# Patient Record
Sex: Male | Born: 1965 | Race: White | Hispanic: No | Marital: Married | Smoking: Former smoker
Health system: Southern US, Community
[De-identification: ages and names within clinical notes are randomized; demographics above are authoritative.]

## PROBLEM LIST (undated history)

## (undated) DIAGNOSIS — I472 Ventricular tachycardia, unspecified: Secondary | ICD-10-CM

## (undated) DIAGNOSIS — IMO0001 Reserved for inherently not codable concepts without codable children: Secondary | ICD-10-CM

## (undated) HISTORY — DX: Ventricular tachycardia: I47.2

## (undated) HISTORY — DX: Ventricular tachycardia, unspecified: I47.20

## (undated) HISTORY — DX: Reserved for inherently not codable concepts without codable children: IMO0001

---

## 1998-11-21 ENCOUNTER — Encounter: Payer: Self-pay | Admitting: Emergency Medicine

## 1998-11-21 ENCOUNTER — Emergency Department (HOSPITAL_COMMUNITY): Admission: EM | Admit: 1998-11-21 | Discharge: 1998-11-21 | Payer: Self-pay | Admitting: Emergency Medicine

## 1998-12-12 ENCOUNTER — Emergency Department (HOSPITAL_COMMUNITY): Admission: EM | Admit: 1998-12-12 | Discharge: 1998-12-12 | Payer: Self-pay | Admitting: Emergency Medicine

## 1998-12-12 ENCOUNTER — Encounter: Payer: Self-pay | Admitting: Emergency Medicine

## 2002-07-16 ENCOUNTER — Emergency Department (HOSPITAL_COMMUNITY): Admission: EM | Admit: 2002-07-16 | Discharge: 2002-07-16 | Payer: Self-pay | Admitting: Emergency Medicine

## 2002-07-16 ENCOUNTER — Encounter: Payer: Self-pay | Admitting: Emergency Medicine

## 2003-12-23 ENCOUNTER — Ambulatory Visit: Payer: Self-pay | Admitting: Internal Medicine

## 2005-05-30 ENCOUNTER — Ambulatory Visit: Payer: Self-pay | Admitting: Internal Medicine

## 2007-12-09 ENCOUNTER — Ambulatory Visit: Payer: Self-pay | Admitting: Internal Medicine

## 2009-11-04 ENCOUNTER — Telehealth: Payer: Self-pay | Admitting: Internal Medicine

## 2009-12-21 ENCOUNTER — Encounter (INDEPENDENT_AMBULATORY_CARE_PROVIDER_SITE_OTHER): Payer: Self-pay | Admitting: *Deleted

## 2010-03-03 NOTE — Letter (Signed)
Summary: Appointment - Missed  [facility], Main Office  [address]   [place], [place] [address]   Phone: [phone]  Fax: [phone]     December 21, 2009 MRN: [id number]   Cody Harris [address] [place], [place]  [address]   Dear Cody Harris,  Our records indicate you missed your appointment on 12-09-2009  with  Dr. Graciela Husbands  It is very important that we reach you to reschedule this appointment. We look forward to participating in your health care needs. Please contact us at the number listed above at your earliest convenience to reschedule this appointment.     Sincerely,   Lorne Skeens  [facility] Scheduling Team

## 2010-03-03 NOTE — Progress Notes (Signed)
Summary: refill needed   Phone Note Refill Request Call back at [phone] Message from:  wife on Cody Harris  Refills Requested: Medication #1:  ATENOLOL 50 MG TABS take one tablet once daily. [facility] [address]  Refill needed  Wife states refill was denied, per pharmacy   Method Requested: Electronic Initial call taken by: Burnard Leigh,  November 04, 2009 12:38 PM  Follow-up for Phone Call        Refill was denied based on pt not having been seen here since 2009.  Pt has been informed that he needs an ov or we need to know if he has established with another care provider. Called spouse back at number left and left msg on voicemail to call us back.  Either appt needs to be scheduled or pt will need to be followed by another physician Follow-up by: Judithe Modest CMA,  November 04, 2009 1:36 PM    Prescriptions: ATENOLOL 50 MG TABS (ATENOLOL) take one tablet once daily  #90 x 2   Entered by:   Judithe Modest CMA   Authorized by:   Nathen May, MD, [facility]   Signed by:   Judithe Modest CMA on 11/04/2009   Method used:   Electronically to        [facility]  [address] [id number]* (retail)       [address]       [place], [place]  [address]       Ph: [phone] or [phone]       Fax: [phone]   RxID:   [id number]

## 2010-06-14 NOTE — Assessment & Plan Note (Signed)
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  He has done quite quiescent without episodes  of tachy palpitations for sometime.  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  He is currently  in [place] and [place].   Medications include:  1. Atenolol 50.  2. Multivitamins.   On examination, his blood pressure is 161/100.  He insists that his  blood pressure when he records it twice a week at home is in the 130/70  range.  His pulse is 60.  His weight is 276 which is, unfortunately for  him, relatively stable and maybe increased some.  He had gone from 290-  238 about 3 years ago and has put the other way back on.  He is walking  6-10 miles a day.  He says his lungs were clear.  Heart sounds were  regular.  Extremities were without edema.   Electrocardiogram dated today demonstrated sinus rhythm at 60 with  intervals of 0.16/0.10/0.41.   IMPRESSION:  1. 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  2. White coat hypertension.   Cody Harris is stable at this point.  We will see him again 1 year's  time.     Duke Salvia, MD, [facility]  Electronically Signed    SCK/MedQ  DD: 12/09/2007  DT: 12/10/2007  Job #: [id number]

## 2010-07-28 ENCOUNTER — Other Ambulatory Visit: Payer: Self-pay | Admitting: Internal Medicine

## 2010-09-06 ENCOUNTER — Other Ambulatory Visit: Payer: Self-pay | Admitting: Internal Medicine

## 2010-10-07 ENCOUNTER — Other Ambulatory Visit: Payer: Self-pay | Admitting: Internal Medicine

## 2010-11-07 ENCOUNTER — Other Ambulatory Visit: Payer: Self-pay | Admitting: Internal Medicine

## 2010-11-07 ENCOUNTER — Telehealth: Payer: Self-pay | Admitting: *Deleted

## 2010-11-07 MED ORDER — ATENOLOL 50 MG PO TABS: 50.0000 mg | ORAL_TABLET | Freq: Every day | ORAL | Status: DC

## 2010-11-07 NOTE — Telephone Encounter (Signed)
Pt came in office today requesting we refill front desk took message regarding  his atenolol we told him in the past to come for an appt will refill med 1 time until next vist. Called pt phone disconnected. Also called job number given went to voice mail

## 2010-12-05 ENCOUNTER — Other Ambulatory Visit: Payer: Self-pay | Admitting: Internal Medicine

## 2011-03-05 ENCOUNTER — Other Ambulatory Visit: Payer: Self-pay | Admitting: Internal Medicine

## 2011-03-06 NOTE — Telephone Encounter (Signed)
rx request. Pt asked to make appt in past for refills, never did. Never was able to contact pt at home or work. Has not had appt seen  Nov 2009.

## 2011-03-24 ENCOUNTER — Telehealth: Payer: Self-pay | Admitting: Internal Medicine

## 2011-03-24 DIAGNOSIS — I472 Ventricular tachycardia: Secondary | ICD-10-CM

## 2011-03-24 MED ORDER — ATENOLOL 50 MG PO TABS: ORAL_TABLET | ORAL | Status: DC

## 2011-03-24 NOTE — Telephone Encounter (Signed)
I spoke with the patient's wife. She states that he needs his atenolol refilled. I explained to her that he has not been seen in almost 4 years and that we legally cannot continue to fill medications for patients we are not seeing on a yearly basis. I offered him an appointment on 2/27 for follow up with Dr. Graciela Husbands. She states she is scheduled for cardiac surgery on 2/28. She states that "before her surgery is not a good time for him to come in." He is not seeing any one else. I explained I will give him #30 tablets on his atenolol, but I cannot authorize refills after that. He MUST come in for an office visit. She verbalizes understanding. I will send the prescription to [facility] on [address]. His office visit with Dr. Graciela Husbands is scheduled for 04/24/11 at 9:45 am.

## 2011-03-24 NOTE — Telephone Encounter (Signed)
New msg Pt's wife wants to talk to you about his meds and her surgery and when he can schedule another appt. Please call

## 2011-04-24 ENCOUNTER — Ambulatory Visit (INDEPENDENT_AMBULATORY_CARE_PROVIDER_SITE_OTHER): Payer: Self-pay | Admitting: Internal Medicine

## 2011-04-24 ENCOUNTER — Encounter: Payer: Self-pay | Admitting: Internal Medicine

## 2011-04-24 ENCOUNTER — Other Ambulatory Visit: Payer: Self-pay | Admitting: Internal Medicine

## 2011-04-24 VITALS — BP 195/121 | HR 65 | Ht 75.0 in | Wt 274.1 lb

## 2011-04-24 DIAGNOSIS — R030 Elevated blood-pressure reading, without diagnosis of hypertension: Secondary | ICD-10-CM

## 2011-04-24 DIAGNOSIS — I10 Essential (primary) hypertension: Secondary | ICD-10-CM | POA: Insufficient documentation

## 2011-04-24 DIAGNOSIS — IMO0001 Reserved for inherently not codable concepts without codable children: Secondary | ICD-10-CM

## 2011-04-24 DIAGNOSIS — I472 Ventricular tachycardia: Secondary | ICD-10-CM

## 2011-04-24 MED ORDER — ATENOLOL 50 MG PO TABS: ORAL_TABLET | ORAL | Status: DC

## 2011-04-24 MED ORDER — LISINOPRIL 20 MG PO TABS: 20.0000 mg | ORAL_TABLET | Freq: Every day | ORAL | Status: DC

## 2011-04-24 NOTE — Assessment & Plan Note (Signed)
No recurrent ventricular tachycardia continue atenolol

## 2011-04-24 NOTE — Telephone Encounter (Signed)
New Msg: Pt is out of RX. Please call in ASAP.

## 2011-04-24 NOTE — Patient Instructions (Signed)
Your physician has recommended you make the following change in your medication:  1) Start lisinopril 20 mg 1/2 tablet by mouth once daily.  Your physician recommends that you return for lab work in: 2 weeks- bmp  You will need to have a Blood Pressure check with the nurse in: 2 weeks  Your physician wants you to follow-up in: 1 year with Dr. Graciela Husbands. You will receive a reminder letter in the mail two months in advance. If you don't receive a letter, please call our office to schedule the follow-up appointment.
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
## 2011-04-24 NOTE — Assessment & Plan Note (Signed)
He has been under a great deal of psychosocial stress is outlined partially above. We will continue his atenolol and begin lisinopril at 10 and it 20 mg a day. We will check a metabolic profile in 2 weeks time. We'll should recheck a blood pressure at that point and decide what is the best followup

## 2011-05-10 ENCOUNTER — Other Ambulatory Visit (INDEPENDENT_AMBULATORY_CARE_PROVIDER_SITE_OTHER): Payer: Self-pay

## 2011-05-10 DIAGNOSIS — R030 Elevated blood-pressure reading, without diagnosis of hypertension: Secondary | ICD-10-CM

## 2011-05-10 DIAGNOSIS — I472 Ventricular tachycardia: Secondary | ICD-10-CM

## 2011-05-10 DIAGNOSIS — IMO0001 Reserved for inherently not codable concepts without codable children: Secondary | ICD-10-CM

## 2011-05-10 LAB — BASIC METABOLIC PANEL
BUN: 21 mg/dL (ref 6–23)
CO2: 26 mEq/L (ref 19–32)
Chloride: 106 mEq/L (ref 96–112)
Creatinine, Ser: 0.9 mg/dL (ref 0.4–1.5)
Glucose, Bld: 110 mg/dL — ABNORMAL HIGH (ref 70–99)
Potassium: 4.2 mEq/L (ref 3.5–5.1)

## 2011-05-11 ENCOUNTER — Telehealth: Payer: Self-pay | Admitting: Cardiology

## 2011-05-11 NOTE — Telephone Encounter (Signed)
Fu call °Patient returning your call °
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
## 2011-05-19 ENCOUNTER — Other Ambulatory Visit: Payer: Self-pay | Admitting: Internal Medicine

## 2011-05-19 NOTE — Telephone Encounter (Signed)
..   Requested Prescriptions   Pending Prescriptions Disp Refills  . atenolol (TENORMIN) 50 MG tablet [Pharmacy Med Name: ATENOLOL 50 MG TABLET] 30 tablet 11    Sig: take 1 tablet by mouth once daily

## 2011-05-23 ENCOUNTER — Ambulatory Visit (INDEPENDENT_AMBULATORY_CARE_PROVIDER_SITE_OTHER): Payer: Self-pay | Admitting: *Deleted

## 2011-05-23 VITALS — BP 172/100 | HR 64 | Resp 14 | Ht 76.0 in | Wt 272.0 lb

## 2011-05-23 DIAGNOSIS — I10 Essential (primary) hypertension: Secondary | ICD-10-CM

## 2011-05-23 NOTE — Progress Notes (Signed)
Nurse visit for BP check. Pt took both BP med's at 6:30 am// 2.5 hrs ago. I let pt sit for 5 minutes and used a large cuff on left arm. Pt appears anxious, speech fast. States he is upset re: family matters. He states he already knows his BP is high. BP 172/100 p 64. He states his BP this am @home  was 142/87. Pt has not had caffeine for two weeks, no breakfast yet this am and last nights dinner was grilled skinless chicken, steamed zucchini and broccoli. Pt understands that Dr/Nurse not here and will hear back Wednesday. Please advise.

## 2011-07-07 ENCOUNTER — Telehealth: Payer: Self-pay | Admitting: *Deleted

## 2011-07-07 NOTE — Telephone Encounter (Signed)
H could you please have him change the lisinopril 20>20/12.5 We should recehck his bp and bemt in about 2-3 weeks thanks ----- Message ----- From: Antony Odea, RN Sent: 05/23/2011 9:08 AM To: Duke Salvia, MD, Jefferey Pica, RN  Please advise  I left a message for the patient to call.

## 2011-07-07 NOTE — Telephone Encounter (Signed)
I spoke with the patient. He states that he has been checking his BP's at home and they are now down to 120-130's/65-79. He states he has gotten rid of some stress in his life and he has also lost about 10-15 pounds. He is still on lisinopril 20 mg once daily. He will continue to monitor his blood pressures and call us back if they go back up and remain that way.

## 2011-09-30 ENCOUNTER — Other Ambulatory Visit: Payer: Self-pay | Admitting: Internal Medicine

## 2012-03-14 ENCOUNTER — Emergency Department (HOSPITAL_COMMUNITY): Payer: Self-pay

## 2012-03-14 ENCOUNTER — Encounter (HOSPITAL_COMMUNITY): Payer: Self-pay | Admitting: *Deleted

## 2012-03-14 ENCOUNTER — Inpatient Hospital Stay (HOSPITAL_COMMUNITY)
Admission: EM | Admit: 2012-03-14 | Discharge: 2012-03-16 | DRG: 352 | Disposition: A | Discharge status: Home / Self Care | Payer: MEDICAID | Attending: Surgery | Admitting: Surgery

## 2012-03-14 DIAGNOSIS — K469 Unspecified abdominal hernia without obstruction or gangrene: Secondary | ICD-10-CM

## 2012-03-14 DIAGNOSIS — K4030 Unilateral inguinal hernia, with obstruction, without gangrene, not specified as recurrent: Principal | ICD-10-CM | POA: Diagnosis present

## 2012-03-14 DIAGNOSIS — Z87891 Personal history of nicotine dependence: Secondary | ICD-10-CM

## 2012-03-14 DIAGNOSIS — N5089 Other specified disorders of the male genital organs: Secondary | ICD-10-CM | POA: Diagnosis present

## 2012-03-14 DIAGNOSIS — I10 Essential (primary) hypertension: Secondary | ICD-10-CM | POA: Diagnosis present

## 2012-03-14 LAB — COMPREHENSIVE METABOLIC PANEL
Albumin: 4.3 g/dL (ref 3.5–5.2)
Alkaline Phosphatase: 69 U/L (ref 39–117)
BUN: 16 mg/dL (ref 6–23)
Calcium: 9.5 mg/dL (ref 8.4–10.5)
Creatinine, Ser: 0.75 mg/dL (ref 0.50–1.35)
GFR calc Af Amer: >90 mL/min (ref >90)
Glucose, Bld: 94 mg/dL (ref 70–99)
Total Protein: 7.1 g/dL (ref 6.0–8.3)

## 2012-03-14 LAB — CBC WITH DIFFERENTIAL/PLATELET
Basophils Relative: 0 % (ref 0–1)
Eosinophils Absolute: 0.1 10*3/uL (ref 0.0–0.7)
Eosinophils Relative: 1 % (ref 0–5)
Hemoglobin: 16.3 g/dL (ref 13.0–17.0)
Lymphs Abs: 1.4 10*3/uL (ref 0.7–4.0)
MCH: 29.4 pg (ref 26.0–34.0)
MCHC: 34.5 g/dL (ref 30.0–36.0)
MCV: 85 fL (ref 78.0–100.0)
Monocytes Absolute: 0.5 10*3/uL (ref 0.1–1.0)
Monocytes Relative: 6 % (ref 3–12)
RBC: 5.55 MIL/uL (ref 4.22–5.81)

## 2012-03-14 LAB — LACTIC ACID, PLASMA: Lactic Acid, Venous: 0.9 mmol/L (ref 0.5–2.2)

## 2012-03-14 MED ORDER — DIAZEPAM 5 MG PO TABS
5.0000 mg | ORAL_TABLET | Freq: Once | ORAL | Status: AC
  Administered 2012-03-14: 5 mg via ORAL
  Filled 2012-03-14: qty 1

## 2012-03-14 MED ORDER — OXYCODONE-ACETAMINOPHEN 5-325 MG PO TABS
1.0000 | ORAL_TABLET | Freq: Once | ORAL | Status: AC
  Administered 2012-03-14: 1 via ORAL
  Filled 2012-03-14: qty 1

## 2012-03-14 MED ORDER — MORPHINE SULFATE 4 MG/ML IJ SOLN
4.0000 mg | Freq: Once | INTRAMUSCULAR | Status: AC
  Administered 2012-03-14: 4 mg via INTRAVENOUS
  Filled 2012-03-14: qty 1

## 2012-03-14 NOTE — ED Notes (Signed)
Pt states that he has had an undiagnosed inguinal hernia left side (self diagnosed.) pt states that the hernia has been there for a couple of months, pt states heavy lifting everyday. Pt states he was sledding today with kids and the bulge became enlarged and started hurting more.

## 2012-03-14 NOTE — ED Provider Notes (Signed)
I have supervised the resident on the management of this patient and agree with the note above. I personally interviewed and examined the patient and my addendum is below.   Cody Harris is a 47 y.o. male hx of inguinal hernia here with hernia not reducible. He works in [employer] so does a lot of heavy lifting. Today was sledding and then his hernia is not reducible. No vomiting, still passing gas. Will give pain meds and attempt to reduce hernia.   11:27 PM Unable to reduce hernia. Surgery consulted to reduce hernia.   Results for orders placed during the hospital encounter of 03/14/12  CBC WITH DIFFERENTIAL      Result Value Range   WBC 8.5  4.0 - 10.5 K/uL   RBC 5.55  4.22 - 5.81 MIL/uL   Hemoglobin 16.3  13.0 - 17.0 g/dL   HCT 09.8  11.9 - 14.7 %   MCV 85.0  78.0 - 100.0 fL   MCH 29.4  26.0 - 34.0 pg   MCHC 34.5  30.0 - 36.0 g/dL   RDW 82.9  56.2 - 13.0 %   Platelets 207  150 - 400 K/uL   Neutrophils Relative 76  43 - 77 %   Neutro Abs 6.4  1.7 - 7.7 K/uL   Lymphocytes Relative 16  12 - 46 %   Lymphs Abs 1.4  0.7 - 4.0 K/uL   Monocytes Relative 6  3 - 12 %   Monocytes Absolute 0.5  0.1 - 1.0 K/uL   Eosinophils Relative 1  0 - 5 %   Eosinophils Absolute 0.1  0.0 - 0.7 K/uL   Basophils Relative 0  0 - 1 %   Basophils Absolute 0.0  0.0 - 0.1 K/uL  COMPREHENSIVE METABOLIC PANEL      Result Value Range   Sodium 140  135 - 145 mEq/L   Potassium 3.9  3.5 - 5.1 mEq/L   Chloride 103  96 - 112 mEq/L   CO2 24  19 - 32 mEq/L   Glucose, Bld 94  70 - 99 mg/dL   BUN 16  6 - 23 mg/dL   Creatinine, Ser 8.65  0.50 - 1.35 mg/dL   Calcium 9.5  8.4 - 78.4 mg/dL   Total Protein 7.1  6.0 - 8.3 g/dL   Albumin 4.3  3.5 - 5.2 g/dL   AST 19  0 - 37 U/L   ALT 16  0 - 53 U/L   Alkaline Phosphatase 69  39 - 117 U/L   Total Bilirubin 0.5  0.3 - 1.2 mg/dL   GFR calc non Af Amer >90  >90 mL/min   GFR calc Af Amer >90  >90 mL/min  LACTIC ACID, PLASMA      Result Value Range   Lactic Acid,  Venous 0.9  0.5 - 2.2 mmol/L   No results found.     Richardean Canal, MD 03/14/12 [phone]

## 2012-03-14 NOTE — ED Provider Notes (Signed)
History     CSN: [id number]  Arrival date & time 03/14/12  2105   First MD Initiated Contact with Patient 03/14/12 2117      Chief Complaint  Patient presents with  . Groin Pain    possible inguinal hernia    (Consider location/radiation/quality/duration/timing/severity/associated sxs/prior Treatment)  HPI Cody Harris is a 47 y.o. male who presents to the emergency department for concern of groin pain.  Patient with history of R inguinal hernia for several years that was always reducible, however today, patient sledding with his children and felt a large pressure in to his groin.  About three times the size of normal pushed in to his scrotum.  Not able to reduce.  Pain moderate in severity, located in R groin.  No radiation.  No nausea/vomiting.  No other symptoms.  Past Medical History  Diagnosis Date  . Ventricular tachycardia     Left ventricular-Belhassen  . White coat hypertension     History reviewed. No pertinent past surgical history.  History reviewed. No pertinent family history.  History  Substance Use Topics  . Smoking status: Former Smoker    Types: Cigarettes  . Smokeless tobacco: Never Used  . Alcohol Use: Yes     Comment: occasional      Review of Systems  Constitutional: Negative for fever and chills.  HENT: Negative for congestion, sore throat and neck pain.   Respiratory: Negative for cough.   Gastrointestinal: Negative for nausea, vomiting, abdominal pain, diarrhea and constipation.  Endocrine: Negative for polyuria.  Genitourinary: Negative for dysuria and hematuria.  Skin: Negative for rash.  Neurological: Negative for headaches.  Psychiatric/Behavioral: Negative.     Allergies  Zithromax  Home Medications   Current Outpatient Rx  Name  Route  Sig  Dispense  Refill  . atenolol (TENORMIN) 50 MG tablet   Oral   Take 50 mg by mouth every morning.          Marland Kitchen lisinopril (PRINIVIL,ZESTRIL) 20 MG tablet   Oral   Take 20 mg by  mouth every morning.            BP 170/117  Pulse 107  Temp(Src) 98.9 F (37.2 C) (Oral)  Resp 18  SpO2 98%  Physical Exam  Nursing note and vitals reviewed. Constitutional: He is oriented to person, place, and time. He appears well-developed and well-nourished. No distress.  HENT:  Head: Normocephalic and atraumatic.  Right Ear: External ear normal.  Left Ear: External ear normal.  Mouth/Throat: Oropharynx is clear and moist. No oropharyngeal exudate.  Eyes: Conjunctivae are normal. Pupils are equal, round, and reactive to light. Right eye exhibits no discharge.  Neck: Normal range of motion. Neck supple. No tracheal deviation present.  Cardiovascular: Normal rate, regular rhythm and intact distal pulses.   Pulmonary/Chest: Effort normal. No respiratory distress. He has no wheezes. He has no rales.  Abdominal: Soft. He exhibits no distension. There is no tenderness. There is no rebound and no guarding.  Genitourinary: Penis normal. Right testis shows mass (large hernia.  not reducibile.  TTP.). Circumcised.  Musculoskeletal: Normal range of motion.  Neurological: He is alert and oriented to person, place, and time.  Skin: Skin is warm and dry. No rash noted. He is not diaphoretic.  Psychiatric: He has a normal mood and affect.    ED Course  Procedures (including critical care time)  Labs Reviewed - No data to display No results found.   No diagnosis found.  MDM   Cody Harris is a 47 y.o. male who presents to the ED with R sided inguinal hernia that patient unable to reduce.  Repeated attempts by myself and attending without success.  Surgery consulted.  Surgery wanting to operate.  Patient admitted.       Arloa Koh, MD 03/15/12 1610

## 2012-03-14 NOTE — ED Notes (Signed)
MD at bedside. Dr. Janee Morn, General Surgery at bedside.

## 2012-03-15 ENCOUNTER — Encounter (HOSPITAL_COMMUNITY): Admission: EM | Disposition: A | Discharge status: Home / Self Care | Payer: Self-pay | Source: Home / Self Care

## 2012-03-15 ENCOUNTER — Encounter (HOSPITAL_COMMUNITY): Payer: Self-pay | Admitting: General Surgery

## 2012-03-15 ENCOUNTER — Encounter (HOSPITAL_COMMUNITY): Payer: Self-pay | Admitting: Certified Registered Nurse Anesthetist

## 2012-03-15 ENCOUNTER — Inpatient Hospital Stay (HOSPITAL_COMMUNITY): Payer: Self-pay | Admitting: Certified Registered Nurse Anesthetist

## 2012-03-15 DIAGNOSIS — K4090 Unilateral inguinal hernia, without obstruction or gangrene, not specified as recurrent: Secondary | ICD-10-CM

## 2012-03-15 HISTORY — PX: INGUINAL HERNIA REPAIR: SHX194

## 2012-03-15 LAB — CBC
HCT: 46 % (ref 39.0–52.0)
Hemoglobin: 15.6 g/dL (ref 13.0–17.0)
MCH: 29.2 pg (ref 26.0–34.0)
MCHC: 33.9 g/dL (ref 30.0–36.0)
MCV: 86 fL (ref 78.0–100.0)
RBC: 5.35 MIL/uL (ref 4.22–5.81)

## 2012-03-15 LAB — BASIC METABOLIC PANEL
Chloride: 104 mEq/L (ref 96–112)
Creatinine, Ser: 0.8 mg/dL (ref 0.50–1.35)
GFR calc Af Amer: >90 mL/min (ref >90)
Sodium: 141 mEq/L (ref 135–145)

## 2012-03-15 SURGERY — REPAIR, HERNIA, INGUINAL, INCARCERATED
Anesthesia: General | Site: Abdomen | Laterality: Left | Wound class: Clean

## 2012-03-15 MED ORDER — PROMETHAZINE HCL 25 MG/ML IJ SOLN: 6.2500 mg | INTRAMUSCULAR | Status: DC | PRN

## 2012-03-15 MED ORDER — LACTATED RINGERS IV SOLN
INTRAVENOUS | Status: DC | PRN
  Administered 2012-03-15 (×2): via INTRAVENOUS

## 2012-03-15 MED ORDER — FENTANYL CITRATE 0.05 MG/ML IJ SOLN
INTRAMUSCULAR | Status: AC
  Filled 2012-03-15: qty 2

## 2012-03-15 MED ORDER — BUPIVACAINE HCL (PF) 0.25 % IJ SOLN
INTRAMUSCULAR | Status: AC
  Filled 2012-03-15: qty 30

## 2012-03-15 MED ORDER — MEPERIDINE HCL 25 MG/ML IJ SOLN: 6.2500 mg | INTRAMUSCULAR | Status: DC | PRN

## 2012-03-15 MED ORDER — ONDANSETRON HCL 4 MG/2ML IJ SOLN: 4.0000 mg | Freq: Four times a day (QID) | INTRAMUSCULAR | Status: DC | PRN

## 2012-03-15 MED ORDER — ONDANSETRON HCL 4 MG/2ML IJ SOLN
INTRAMUSCULAR | Status: DC | PRN
  Administered 2012-03-15: 4 mg via INTRAVENOUS

## 2012-03-15 MED ORDER — ROCURONIUM BROMIDE 100 MG/10ML IV SOLN
INTRAVENOUS | Status: DC | PRN
  Administered 2012-03-15: 50 mg via INTRAVENOUS

## 2012-03-15 MED ORDER — HEPARIN SODIUM (PORCINE) 5000 UNIT/ML IJ SOLN
5000.0000 [IU] | Freq: Three times a day (TID) | INTRAMUSCULAR | Status: DC
  Administered 2012-03-15 – 2012-03-16 (×3): 5000 [IU] via SUBCUTANEOUS
  Filled 2012-03-15 (×7): qty 1

## 2012-03-15 MED ORDER — MIDAZOLAM HCL 2 MG/2ML IJ SOLN
INTRAMUSCULAR | Status: AC
  Administered 2012-03-15 (×2): 1 mg
  Filled 2012-03-15: qty 2

## 2012-03-15 MED ORDER — GLYCOPYRROLATE 0.2 MG/ML IJ SOLN
INTRAMUSCULAR | Status: DC | PRN
  Administered 2012-03-15: .6 mg via INTRAVENOUS

## 2012-03-15 MED ORDER — LACTATED RINGERS IV SOLN: INTRAVENOUS | Status: DC

## 2012-03-15 MED ORDER — FENTANYL CITRATE 0.05 MG/ML IJ SOLN
25.0000 ug | INTRAMUSCULAR | Status: DC | PRN
  Administered 2012-03-15 (×2): 50 ug via INTRAVENOUS

## 2012-03-15 MED ORDER — CEFAZOLIN SODIUM-DEXTROSE 2-3 GM-% IV SOLR
2.0000 g | INTRAVENOUS | Status: AC
  Administered 2012-03-15: 2 g via INTRAVENOUS
  Filled 2012-03-15: qty 50

## 2012-03-15 MED ORDER — 0.9 % SODIUM CHLORIDE (POUR BTL) OPTIME
TOPICAL | Status: DC | PRN
  Administered 2012-03-15: 1000 mL

## 2012-03-15 MED ORDER — HYDROCODONE-ACETAMINOPHEN 5-325 MG PO TABS
1.0000 | ORAL_TABLET | ORAL | Status: DC | PRN
  Administered 2012-03-15 – 2012-03-16 (×2): 1 via ORAL
  Administered 2012-03-16 (×2): 2 via ORAL
  Filled 2012-03-15: qty 2
  Filled 2012-03-15: qty 1
  Filled 2012-03-15 (×2): qty 2

## 2012-03-15 MED ORDER — KCL IN DEXTROSE-NACL 20-5-0.45 MEQ/L-%-% IV SOLN
INTRAVENOUS | Status: DC
  Administered 2012-03-15 – 2012-03-16 (×3): via INTRAVENOUS
  Filled 2012-03-15 (×7): qty 1000

## 2012-03-15 MED ORDER — MIDAZOLAM HCL 5 MG/ML IJ SOLN: 2.0000 mg | Freq: Once | INTRAMUSCULAR | Status: DC

## 2012-03-15 MED ORDER — CEPHALEXIN 250 MG/5ML PO SUSR
250.0000 mg | Freq: Three times a day (TID) | ORAL | Status: DC
  Administered 2012-03-15 – 2012-03-16 (×2): 250 mg via ORAL
  Filled 2012-03-15 (×5): qty 5

## 2012-03-15 MED ORDER — BUPIVACAINE HCL (PF) 0.25 % IJ SOLN
INTRAMUSCULAR | Status: DC | PRN
  Administered 2012-03-15: 30 mL

## 2012-03-15 MED ORDER — MIDAZOLAM HCL 5 MG/5ML IJ SOLN
INTRAMUSCULAR | Status: DC | PRN
  Administered 2012-03-15: 2 mg via INTRAVENOUS

## 2012-03-15 MED ORDER — DIPHENHYDRAMINE HCL 50 MG/ML IJ SOLN: 12.5000 mg | Freq: Four times a day (QID) | INTRAMUSCULAR | Status: DC | PRN

## 2012-03-15 MED ORDER — CHLORHEXIDINE GLUCONATE CLOTH 2 % EX PADS
6.0000 | MEDICATED_PAD | Freq: Every day | CUTANEOUS | Status: DC
  Administered 2012-03-16: 6 via TOPICAL

## 2012-03-15 MED ORDER — DIPHENHYDRAMINE HCL 12.5 MG/5ML PO ELIX: 12.5000 mg | ORAL_SOLUTION | Freq: Four times a day (QID) | ORAL | Status: DC | PRN

## 2012-03-15 MED ORDER — PANTOPRAZOLE SODIUM 40 MG IV SOLR
40.0000 mg | Freq: Every day | INTRAVENOUS | Status: DC
  Administered 2012-03-15: 40 mg via INTRAVENOUS
  Filled 2012-03-15 (×2): qty 40

## 2012-03-15 MED ORDER — FENTANYL CITRATE 0.05 MG/ML IJ SOLN
INTRAMUSCULAR | Status: DC | PRN
  Administered 2012-03-15: 125 ug via INTRAVENOUS
  Administered 2012-03-15: 25 ug via INTRAVENOUS

## 2012-03-15 MED ORDER — MORPHINE SULFATE 2 MG/ML IJ SOLN: 1.0000 mg | INTRAMUSCULAR | Status: DC | PRN

## 2012-03-15 MED ORDER — NICOTINE 14 MG/24HR TD PT24
14.0000 mg | MEDICATED_PATCH | Freq: Every day | TRANSDERMAL | Status: DC
  Filled 2012-03-15 (×2): qty 1

## 2012-03-15 MED ORDER — PROPOFOL 10 MG/ML IV BOLUS
INTRAVENOUS | Status: DC | PRN
  Administered 2012-03-15: 200 mg via INTRAVENOUS

## 2012-03-15 MED ORDER — NEOSTIGMINE METHYLSULFATE 1 MG/ML IJ SOLN
INTRAMUSCULAR | Status: DC | PRN
  Administered 2012-03-15: 4 mg via INTRAVENOUS

## 2012-03-15 MED ORDER — HYDROMORPHONE HCL PF 1 MG/ML IJ SOLN: 1.0000 mg | INTRAMUSCULAR | Status: DC | PRN

## 2012-03-15 MED ORDER — ATENOLOL 50 MG PO TABS
50.0000 mg | ORAL_TABLET | Freq: Every morning | ORAL | Status: DC
  Administered 2012-03-16: 50 mg via ORAL
  Filled 2012-03-15: qty 1

## 2012-03-15 MED ORDER — MUPIROCIN 2 % EX OINT
1.0000 "application " | TOPICAL_OINTMENT | Freq: Two times a day (BID) | CUTANEOUS | Status: DC
  Administered 2012-03-15 – 2012-03-16 (×2): 1 via NASAL
  Filled 2012-03-15: qty 22

## 2012-03-15 MED ORDER — METOPROLOL TARTRATE 1 MG/ML IV SOLN
5.0000 mg | Freq: Four times a day (QID) | INTRAVENOUS | Status: DC | PRN
  Filled 2012-03-15: qty 5

## 2012-03-15 SURGICAL SUPPLY — 53 items
ADH SKN CLS APL DERMABOND .7 (GAUZE/BANDAGES/DRESSINGS) ×1
BENZOIN TINCTURE PRP APPL 2/3 (GAUZE/BANDAGES/DRESSINGS) IMPLANT
BLADE SURG 10 STRL SS (BLADE) ×2 IMPLANT
BLADE SURG 15 STRL LF DISP TIS (BLADE) ×1 IMPLANT
BLADE SURG 15 STRL SS (BLADE) ×1
BLADE SURG ROTATE 9660 (MISCELLANEOUS) ×2 IMPLANT
CANISTER SUCTION 2500CC (MISCELLANEOUS) IMPLANT
CHLORAPREP W/TINT 26ML (MISCELLANEOUS) ×2 IMPLANT
CLOTH BEACON ORANGE TIMEOUT ST (SAFETY) ×2 IMPLANT
COVER SURGICAL LIGHT HANDLE (MISCELLANEOUS) ×2 IMPLANT
COVER TRANSDUCER ULTRASND GEL (DRAPE) ×2 IMPLANT
DECANTER SPIKE VIAL GLASS SM (MISCELLANEOUS) IMPLANT
DERMABOND ADVANCED (GAUZE/BANDAGES/DRESSINGS) ×1
DERMABOND ADVANCED .7 DNX12 (GAUZE/BANDAGES/DRESSINGS) ×1 IMPLANT
DRAIN PENROSE 1/2X12 LTX STRL (WOUND CARE) ×2 IMPLANT
DRAPE LAPAROTOMY TRNSV 102X78 (DRAPE) ×2 IMPLANT
DRAPE UTILITY 15X26 W/TAPE STR (DRAPE) ×4 IMPLANT
ELECT CAUTERY BLADE 6.4 (BLADE) ×2 IMPLANT
ELECT REM PT RETURN 9FT ADLT (ELECTROSURGICAL) ×2
ELECTRODE REM PT RTRN 9FT ADLT (ELECTROSURGICAL) ×1 IMPLANT
GLOVE BIOGEL PI IND STRL 7.0 (GLOVE) ×2 IMPLANT
GLOVE BIOGEL PI INDICATOR 7.0 (GLOVE) ×2
GLOVE ECLIPSE 6.5 STRL STRAW (GLOVE) ×2 IMPLANT
GLOVE SURG SIGNA 7.5 PF LTX (GLOVE) ×2 IMPLANT
GLOVE SURG SS PI 7.0 STRL IVOR (GLOVE) ×2 IMPLANT
GOWN STRL NON-REIN LRG LVL3 (GOWN DISPOSABLE) ×4 IMPLANT
GOWN STRL REIN XL XLG (GOWN DISPOSABLE) ×2 IMPLANT
KIT BASIN OR (CUSTOM PROCEDURE TRAY) ×2 IMPLANT
KIT ROOM TURNOVER OR (KITS) ×2 IMPLANT
MESH ULTRAPRO 3X6 7.6X15CM (Mesh General) ×2 IMPLANT
NEEDLE HYPO 25GX1X1/2 BEV (NEEDLE) ×2 IMPLANT
NS IRRIG 1000ML POUR BTL (IV SOLUTION) ×2 IMPLANT
PACK SURGICAL SETUP 50X90 (CUSTOM PROCEDURE TRAY) ×2 IMPLANT
PAD ARMBOARD 7.5X6 YLW CONV (MISCELLANEOUS) ×2 IMPLANT
PENCIL BUTTON HOLSTER BLD 10FT (ELECTRODE) ×2 IMPLANT
SPECIMEN JAR SMALL (MISCELLANEOUS) IMPLANT
SPONGE GAUZE 4X4 12PLY (GAUZE/BANDAGES/DRESSINGS) ×2 IMPLANT
SPONGE INTESTINAL PEANUT (DISPOSABLE) ×2 IMPLANT
SPONGE LAP 18X18 X RAY DECT (DISPOSABLE) ×2 IMPLANT
STRIP CLOSURE SKIN 1/2X4 (GAUZE/BANDAGES/DRESSINGS) IMPLANT
SUT NOVA 0 T19/GS 22DT (SUTURE) ×6 IMPLANT
SUT VIC AB 2-0 SH 27 (SUTURE) ×2
SUT VIC AB 2-0 SH 27X BRD (SUTURE) ×1 IMPLANT
SUT VIC AB 3-0 SH 18 (SUTURE) ×4 IMPLANT
SUT VIC AB 5-0 PS2 18 (SUTURE) ×2 IMPLANT
SUT VICRYL AB 2 0 TIES (SUTURE) ×2 IMPLANT
SYR BULB 3OZ (MISCELLANEOUS) ×2 IMPLANT
SYR CONTROL 10ML LL (SYRINGE) ×2 IMPLANT
TOWEL OR 17X24 6PK STRL BLUE (TOWEL DISPOSABLE) ×2 IMPLANT
TOWEL OR 17X26 10 PK STRL BLUE (TOWEL DISPOSABLE) ×2 IMPLANT
TUBE CONNECTING 12X1/4 (SUCTIONS) ×2 IMPLANT
WATER STERILE IRR 1000ML POUR (IV SOLUTION) IMPLANT
YANKAUER SUCT BULB TIP NO VENT (SUCTIONS) ×2 IMPLANT

## 2012-03-15 NOTE — Progress Notes (Signed)
I reviewed chart, examined patient, and discussed patient with Dr. Andrey Campanile.  He presents with a chronically incarcerated left inguinal hernia.  Wife and twin children in room with patient.  He works in remodeling for himself.  I discussed the indications and complications of hernia surgery with the patient.  I discussed both the laparoscopic and open approach to hernia repair.  He is not a candidate for laparoscopic repair because of the size and chronic incarceration.  The potential risks of hernia surgery include, but are not limited to, bleeding, infection, open surgery, nerve injury, and recurrence of the hernia.   Plan open repair later today.  Cody Kin, MD, [facility] Surgery Pager: [phone] Office phone:  [phone]

## 2012-03-15 NOTE — Anesthesia Procedure Notes (Signed)
Procedure Name: Intubation Date/Time: 03/15/2012 11:40 AM Performed by: Rogelia Boga Pre-anesthesia Checklist: Patient identified, Emergency Drugs available, Suction available, Patient being monitored and Timeout performed Patient Re-evaluated:Patient Re-evaluated prior to inductionOxygen Delivery Method: Circle system utilized Preoxygenation: Pre-oxygenation with 100% oxygen Intubation Type: IV induction Ventilation: Mask ventilation without difficulty and Oral airway inserted - appropriate to patient size Laryngoscope Size: Mac and 4 Grade View: Grade II Tube type: Oral Tube size: 7.5 mm Number of attempts: 1 Airway Equipment and Method: Stylet Placement Confirmation: ETT inserted through vocal cords under direct vision,  positive ETCO2 and breath sounds checked- equal and bilateral Secured at: 22 cm Tube secured with: Tape Dental Injury: Teeth and Oropharynx as per pre-operative assessment

## 2012-03-15 NOTE — Anesthesia Preprocedure Evaluation (Addendum)
Anesthesia Evaluation  Patient identified by MRN, date of birth, ID band Patient awake    Reviewed: Allergy & Precautions, H&P , NPO status , Patient's Chart, lab work & pertinent test results  History of Anesthesia Complications Negative for: history of anesthetic complications  Airway  TM Distance: >3 FB Neck ROM: Full    Dental  (+) Teeth Intact and Poor Dentition   Pulmonary neg pulmonary ROS, Current Smoker,  breath sounds clear to auscultation        Cardiovascular hypertension, Pt. on medications and Pt. on home beta blockers + dysrhythmias Ventricular Tachycardia Rhythm:Regular Rate:Normal     Neuro/Psych    GI/Hepatic negative GI ROS, Neg liver ROS,   Endo/Other  negative endocrine ROS  Renal/GU negative Renal ROS     Musculoskeletal negative musculoskeletal ROS (+)   Abdominal   Peds  Hematology negative hematology ROS (+)   Anesthesia Other Findings   Reproductive/Obstetrics                         Anesthesia Physical Anesthesia Plan  ASA: II  Anesthesia Plan: General   Post-op Pain Management:    Induction: Intravenous  Airway Management Planned: Oral ETT  Additional Equipment:   Intra-op Plan:   Post-operative Plan: Extubation in OR  Informed Consent: I have reviewed the patients History and Physical, chart, labs and discussed the procedure including the risks, benefits and alternatives for the proposed anesthesia with the patient or authorized representative who has indicated his/her understanding and acceptance.   Dental advisory given  Plan Discussed with: CRNA, Surgeon and Anesthesiologist  Anesthesia Plan Comments:        Anesthesia Quick Evaluation

## 2012-03-15 NOTE — Preoperative (Signed)
Beta Blockers   Reason not to administer Beta Blockers:Hold  beta blocker due to Bradycardia (HR less than 50 bpm) 

## 2012-03-15 NOTE — H&P (Signed)
Cody Harris Cody Harris is an 47 y.o. male.   Chief Complaint: my hernia is larger than normal HPI: 47 year old Caucasian male with a history of hypertension who states he has had a known left inguinal hernia for several years Comes to the ER this evening because his hernia suddenly enlarged in size this afternoon. He states that his hernia in his scrotum got enlarged about A month or so ago after moving a large cast iron tub. However he did not have any significant discomfort at that time. It may cause him occasional ache or discomfort but nothing of any significance. He denies any significant history of burning or stinging pain in his groin. This afternoon he went snow sledding with his children and afterwards he felt that his hernia had significantly increased in size. He also noticed some more discomfort at the base of his scrotum.  He states that it also felt different as well he had a bowel movement earlier today. He denies any fever, chills, nausea, vomiting, diarrhea or constipation. He denies any trouble urinating. He has never had any prior abdominal surgery.  Past Medical History  Diagnosis Date  . Ventricular tachycardia     Left ventricular-Belhassen  . White coat hypertension     History reviewed. No pertinent past surgical history.  History reviewed. No pertinent family history. Social History:  reports that he quit smoking about 6 weeks ago. His smoking use included Cigarettes. He smoked 1.00 pack per day. He has quit using smokeless tobacco. He reports that  drinks alcohol. He reports that he does not use illicit drugs.  Allergies:  Allergies  Allergen Reactions  . Zithromax (Azithromycin) Other (See Comments)    Severe stomach ulcer     (Not in a hospital admission)  Results for orders placed during the hospital encounter of 03/14/12 (from the past 48 hour(s))  CBC WITH DIFFERENTIAL     Status: None   Collection Time    03/14/12 10:34 PM      Result Value Range   WBC 8.5  4.0  - 10.5 K/uL   RBC 5.55  4.22 - 5.81 MIL/uL   Hemoglobin 16.3  13.0 - 17.0 g/dL   HCT 16.1  09.6 - 04.5 %   MCV 85.0  78.0 - 100.0 fL   MCH 29.4  26.0 - 34.0 pg   MCHC 34.5  30.0 - 36.0 g/dL   RDW 40.9  81.1 - 91.4 %   Platelets 207  150 - 400 K/uL   Neutrophils Relative 76  43 - 77 %   Neutro Abs 6.4  1.7 - 7.7 K/uL   Lymphocytes Relative 16  12 - 46 %   Lymphs Abs 1.4  0.7 - 4.0 K/uL   Monocytes Relative 6  3 - 12 %   Monocytes Absolute 0.5  0.1 - 1.0 K/uL   Eosinophils Relative 1  0 - 5 %   Eosinophils Absolute 0.1  0.0 - 0.7 K/uL   Basophils Relative 0  0 - 1 %   Basophils Absolute 0.0  0.0 - 0.1 K/uL  COMPREHENSIVE METABOLIC PANEL     Status: None   Collection Time    03/14/12 10:34 PM      Result Value Range   Sodium 140  135 - 145 mEq/L   Potassium 3.9  3.5 - 5.1 mEq/L   Chloride 103  96 - 112 mEq/L   CO2 24  19 - 32 mEq/L   Glucose, Bld 94  70 - 99  mg/dL   BUN 16  6 - 23 mg/dL   Creatinine, Ser 6.96  0.50 - 1.35 mg/dL   Calcium 9.5  8.4 - 29.5 mg/dL   Total Protein 7.1  6.0 - 8.3 g/dL   Albumin 4.3  3.5 - 5.2 g/dL   AST 19  0 - 37 U/L   ALT 16  0 - 53 U/L   Alkaline Phosphatase 69  39 - 117 U/L   Total Bilirubin 0.5  0.3 - 1.2 mg/dL   GFR calc non Af Amer >90  >90 mL/min   GFR calc Af Amer >90  >90 mL/min   Comment:            The eGFR has been calculated     using the CKD EPI equation.     This calculation has not been     validated in all clinical     situations.     eGFR's persistently     <90 mL/min signify     possible Chronic Kidney Disease.  LACTIC ACID, PLASMA     Status: None   Collection Time    03/14/12 10:35 PM      Result Value Range   Lactic Acid, Venous 0.9  0.5 - 2.2 mmol/L   Dg Abd Acute W/chest  03/14/2012  *RADIOLOGY REPORT*  Clinical Data: Lower abdominal pain.  ACUTE ABDOMEN SERIES (ABDOMEN 2 VIEW & CHEST 1 VIEW)  Comparison: None.  Findings: The lungs are well-aerated and clear.  There is no evidence of focal opacification, pleural  effusion or pneumothorax. The cardiomediastinal silhouette is within normal limits.  The visualized bowel gas pattern is unremarkable.  Scattered stool and air are seen within the colon; there is no evidence of small bowel dilatation to suggest obstruction.  No free intra-abdominal air is identified on the provided upright view.  No acute osseous abnormalities are seen; the sacroiliac joints are unremarkable in appearance.  The clinically described hernia is not well characterized on radiograph.  IMPRESSION:  1.  Unremarkable bowel gas pattern; no free intra-abdominal air seen. 2.  No acute cardiopulmonary process identified. 3.  The clinically described hernia is not well characterized on radiograph.   Original Report Authenticated By: Tonia Ghent, M.D.     Review of Systems  Constitutional: Positive for weight loss (planned - 35 pounds thru diet). Negative for fever and chills.  HENT: Negative for hearing loss, nosebleeds, congestion and neck pain.   Eyes: Negative for blurred vision and double vision.  Respiratory: Negative for shortness of breath.   Cardiovascular: Negative for chest pain, palpitations, orthopnea, leg swelling and PND.       Remote h/o of 1 episode of ventricular tachycardia at age 48, underwent cardioversion, placed on atenolol at that time; denies CP, sob, DOE, PND, orthopnea  Gastrointestinal: Negative for nausea, vomiting, diarrhea, constipation and blood in stool.  Genitourinary: Negative for dysuria and urgency.  Musculoskeletal: Negative for myalgias.  Neurological: Negative for dizziness, sensory change, focal weakness, seizures, loss of consciousness and weakness.  Psychiatric/Behavioral: Negative for suicidal ideas.       Drinks about a 6 pack on weekend    Blood pressure 170/117, pulse 107, temperature 98.9 F (37.2 C), temperature source Oral, resp. rate 18, SpO2 98.00%. Physical Exam  Vitals reviewed. Constitutional: He is oriented to person, place, and time.  He appears well-developed and well-nourished. He is cooperative.  Non-toxic appearance. He does not appear ill. No distress.  Smiling, laughing  HENT:  Head: Normocephalic  and atraumatic.  Right Ear: External ear normal.  Left Ear: External ear normal.  Eyes: Conjunctivae are normal. No scleral icterus.  Neck: Normal range of motion. Neck supple. No tracheal deviation present. No thyromegaly present.  Cardiovascular: Normal rate, regular rhythm, normal heart sounds and intact distal pulses.   Respiratory: Effort normal and breath sounds normal. No respiratory distress. He has no wheezes. He has no rales.  GI: Soft. Bowel sounds are normal. He exhibits no distension. There is no tenderness. There is no rebound and no guarding. A hernia is present. Hernia confirmed positive in the left inguinal area. Hernia confirmed negative in the right inguinal area.  Genitourinary: Testes normal and penis normal.  Large scrotum b/l. Full soft large Left scrotum, only tender on firm palpation, unable to reduce.   Musculoskeletal: Normal range of motion. He exhibits no edema and no tenderness.  Lymphadenopathy:    He has no cervical adenopathy.  Neurological: He is alert and oriented to person, place, and time. He exhibits normal muscle tone.  Skin: Skin is warm and dry. No rash noted. He is not diaphoretic. No erythema.  Scattered pimples at base of neck     Assessment/Plan Incarcerated Left inguinal hernia, chronic HTN H/o Ventricular tachycardia  It appears that his hernia is probably chronically incarcerated. He appears pretty comfortable. There is no signs of strangulation. His groin area is soft and really not tender to palpation. He is afebrile, not acidotic, no elevated white blood cell count  We discussed the etiology of inguinal hernias. We discussed the signs & symptoms of incarceration & strangulation.  We discussed non-operative and operative management.  I do not believe the patient  warrants emergent surgical repair this evening. I gave the patient 2 options. We discussed admission to the hospital this evening with repair on Friday or going home with a short interval repair in the next one to 2 weeks. I believe his chances strangulation is low However I am concerned that he would have ongoing pain and discomfort interfering with his quality of life resulting in additional trips to the emergency department.  The patient has elected to stay overnight with a planned repair on Friday   I described the procedure in detail.  We discussed the risks and benefits including but not limited to bleeding, infection, chronic inguinal pain, nerve entrapment, hernia recurrence, mesh complications, hematoma formation, urinary retention, injury to the testicles, numbness in the groin, blood clots, injury to the surrounding structures, and anesthesia risk. We also discussed the typical post operative recovery course, including no heavy lifting for 6 weeks. I explained that the likelihood of improvement of their symptoms is good.  I did explain to the patient that he is at higher risk for complications due to the fact that his hernia is chronically incarcerated.  We'll check a preoperative EKG.  Mary Sella. Andrey Campanile, MD, FACS General, Bariatric, & Minimally Invasive Surgery [facility], [place]   [facility] M 03/15/2012, 12:51 AM

## 2012-03-15 NOTE — Transfer of Care (Signed)
Immediate Anesthesia Transfer of Care Note  Patient: Cody Harris  Procedure(s) Performed: Procedure(s): HERNIA REPAIR INGUINAL INCARCERATED (Left)  Patient Location: PACU  Anesthesia Type:General  Level of Consciousness: awake, alert , oriented and patient cooperative  Airway & Oxygen Therapy: Patient Spontanous Breathing and Patient connected to nasal cannula oxygen  Post-op Assessment: Report given to PACU RN, Post -op Vital signs reviewed and stable and Patient moving all extremities X 4  Post vital signs: Reviewed and stable  Complications: No apparent anesthesia complications

## 2012-03-15 NOTE — Anesthesia Postprocedure Evaluation (Signed)
  Anesthesia Post-op Note  Patient: Cody Harris  Procedure(s) Performed: Procedure(s) (LRB): HERNIA REPAIR INGUINAL INCARCERATED (Left)  Patient Location: PACU  Anesthesia Type: General  Level of Consciousness: awake and alert   Airway and Oxygen Therapy: Patient Spontanous Breathing  Post-op Pain: mild  Post-op Assessment: Post-op Vital signs reviewed, Patient's Cardiovascular Status Stable, Respiratory Function Stable, Patent Airway and No signs of Nausea or vomiting  Last Vitals:  Filed Vitals:   03/15/12 1345  BP: 125/63  Pulse: 66  Temp: 36.9 C  Resp: 16    Post-op Vital Signs: stable   Complications: No apparent anesthesia complications

## 2012-03-15 NOTE — Consult Note (Signed)
Urology Consult  Referring physician: Dr Ovidio Kin Reason for referral: Swollen scrotum  Chief Complaint: Swollen srotum  History of Present Illness: Patient post op hernia repair; called by Dr Ezzard Standing to assess swollen scrotum; u/sound by Dr Ezzard Standing saw no hydrocele and bilateral testes; no hernia sac in scrotum Patient reports left inguinal swelling but moderate scrotal enlargement after sledding last night; claims much larger since manipulated in ER last night No pain; no fever Modifying factors: There are no other modifying factors  Associated signs and symptoms: There are no other associated signs and symptoms Aggravating and relieving factors: There are no other aggravating or relieving factors Severity: Moderate Duration: Persistent  Past Medical History  Diagnosis Date  . Ventricular tachycardia     Left ventricular-Belhassen  . White coat hypertension    History reviewed. No pertinent past surgical history.  Medications: I have reviewed the patient's current medications. Allergies:  Allergies  Allergen Reactions  . Zithromax (Azithromycin) Other (See Comments)    Severe stomach ulcer    History reviewed. No pertinent family history. Social History:  reports that he quit smoking about 6 weeks ago. His smoking use included Cigarettes. He smoked 1.00 pack per day. He has quit using smokeless tobacco. He reports that  drinks alcohol. He reports that he does not use illicit drugs.  ROS: All systems are reviewed and negative except as noted. Rest ROS negative  Physical Exam:  Vital signs in last 24 hours: Temp:  [97.2 F (36.2 C)-98.9 F (37.2 C)] 98.5 F (36.9 C) (02/14 1445) Pulse Rate:  [50-107] 52 (02/14 1445) Resp:  [12-20] 16 (02/14 1445) BP: (101-170)/(50-136) 101/50 mmHg (02/14 1430) SpO2:  [97 %-100 %] 99 % (02/14 1445) Weight:  [112.855 kg (248 lb 12.8 oz)] 112.855 kg (248 lb 12.8 oz) (02/14 0300)  Cardiovascular: Skin warm; not flushed Respiratory:  Breaths quiet; no shortness of breath Abdomen: No masses Neurological: Normal sensation to touch Musculoskeletal: Normal motor function arms and legs Lymphatics: No inguinal adenopathy Skin: No rashes Genitourinary:impressive diffuse enlargement of scrotum similar to what one sees with fluid overload; no tenderness or cellulitis  Laboratory Data:  Results for orders placed during the hospital encounter of 03/14/12 (from the past 72 hour(s))  CBC WITH DIFFERENTIAL     Status: None   Collection Time    03/14/12 10:34 PM      Result Value Range   WBC 8.5  4.0 - 10.5 K/uL   RBC 5.55  4.22 - 5.81 MIL/uL   Hemoglobin 16.3  13.0 - 17.0 g/dL   HCT 14.7  82.9 - 56.2 %   MCV 85.0  78.0 - 100.0 fL   MCH 29.4  26.0 - 34.0 pg   MCHC 34.5  30.0 - 36.0 g/dL   RDW 13.0  86.5 - 78.4 %   Platelets 207  150 - 400 K/uL   Neutrophils Relative 76  43 - 77 %   Neutro Abs 6.4  1.7 - 7.7 K/uL   Lymphocytes Relative 16  12 - 46 %   Lymphs Abs 1.4  0.7 - 4.0 K/uL   Monocytes Relative 6  3 - 12 %   Monocytes Absolute 0.5  0.1 - 1.0 K/uL   Eosinophils Relative 1  0 - 5 %   Eosinophils Absolute 0.1  0.0 - 0.7 K/uL   Basophils Relative 0  0 - 1 %   Basophils Absolute 0.0  0.0 - 0.1 K/uL  COMPREHENSIVE METABOLIC PANEL     Status:  None   Collection Time    03/14/12 10:34 PM      Result Value Range   Sodium 140  135 - 145 mEq/L   Potassium 3.9  3.5 - 5.1 mEq/L   Chloride 103  96 - 112 mEq/L   CO2 24  19 - 32 mEq/L   Glucose, Bld 94  70 - 99 mg/dL   BUN 16  6 - 23 mg/dL   Creatinine, Ser 1.61  0.50 - 1.35 mg/dL   Calcium 9.5  8.4 - 09.6 mg/dL   Total Protein 7.1  6.0 - 8.3 g/dL   Albumin 4.3  3.5 - 5.2 g/dL   AST 19  0 - 37 U/L   ALT 16  0 - 53 U/L   Alkaline Phosphatase 69  39 - 117 U/L   Total Bilirubin 0.5  0.3 - 1.2 mg/dL   GFR calc non Af Amer >90  >90 mL/min   GFR calc Af Amer >90  >90 mL/min   Comment:            The eGFR has been calculated     using the CKD EPI equation.     This  calculation has not been     validated in all clinical     situations.     eGFR's persistently     <90 mL/min signify     possible Chronic Kidney Disease.  LACTIC ACID, PLASMA     Status: None   Collection Time    03/14/12 10:35 PM      Result Value Range   Lactic Acid, Venous 0.9  0.5 - 2.2 mmol/L  SURGICAL PCR SCREEN     Status: Abnormal   Collection Time    03/15/12  3:10 AM      Result Value Range   MRSA, PCR NEGATIVE  NEGATIVE   Staphylococcus aureus POSITIVE (*) NEGATIVE   Comment:            The Xpert SA Assay (FDA     approved for NASAL specimens     in patients over 42 years of age),     is one component of     a comprehensive surveillance     program.  Test performance has     been validated by [employer] for patients greater     than or equal to 68 year old.     It is not intended     to diagnose infection nor to     guide or monitor treatment.  BASIC METABOLIC PANEL     Status: Abnormal   Collection Time    03/15/12  5:55 AM      Result Value Range   Sodium 141  135 - 145 mEq/L   Potassium 4.0  3.5 - 5.1 mEq/L   Chloride 104  96 - 112 mEq/L   CO2 30  19 - 32 mEq/L   Glucose, Bld 101 (*) 70 - 99 mg/dL   BUN 16  6 - 23 mg/dL   Creatinine, Ser 0.45  0.50 - 1.35 mg/dL   Calcium 9.2  8.4 - 40.9 mg/dL   GFR calc non Af Amer >90  >90 mL/min   GFR calc Af Amer >90  >90 mL/min   Comment:            The eGFR has been calculated     using the CKD EPI equation.     This calculation has not been  validated in all clinical     situations.     eGFR's persistently     <90 mL/min signify     possible Chronic Kidney Disease.  CBC     Status: None   Collection Time    03/15/12  5:55 AM      Result Value Range   WBC 7.0  4.0 - 10.5 K/uL   RBC 5.35  4.22 - 5.81 MIL/uL   Hemoglobin 15.6  13.0 - 17.0 g/dL   HCT 78.4  69.6 - 29.5 %   MCV 86.0  78.0 - 100.0 fL   MCH 29.2  26.0 - 34.0 pg   MCHC 33.9  30.0 - 36.0 g/dL   RDW 28.4  13.2 - 44.0 %   Platelets 189   150 - 400 K/uL   Recent Results (from the past 240 hour(s))  SURGICAL PCR SCREEN     Status: Abnormal   Collection Time    03/15/12  3:10 AM      Result Value Range Status   MRSA, PCR NEGATIVE  NEGATIVE Final   Staphylococcus aureus POSITIVE (*) NEGATIVE Final   Comment:            The Xpert SA Assay (FDA     approved for NASAL specimens     in patients over 36 years of age),     is one component of     a comprehensive surveillance     program.  Test performance has     been validated by [employer] for patients greater     than or equal to 8 year old.     It is not intended     to diagnose infection nor to     guide or monitor treatment.   Creatinine:  Recent Labs  03/14/12 2234 03/15/12 0555  CREATININE 0.75 0.80    Xrays: See report/chart none  Impression/Assessment:  Scrotal edema ? Etiology-   Plan:  Elevate with scrotal support/towel; empiric keflex 500 tid for 5 days; scrotal u/sound tomorrow am for completeness; f/up in my clinic- i will call  Dickie Labarre A 03/15/2012, 4:22 PM

## 2012-03-15 NOTE — Progress Notes (Signed)
Nutrition Brief Note  Patient identified on the Malnutrition Screening Tool (MST) Report  Body mass index is 31.1 kg/(m^2). Patient meets criteria for obese based on current BMI.   Current diet order is Regular.  Diet recently advanced after surgery. Labs and medications reviewed.   Pt admitted for hernia repair. RD met with pt who states he has intentionally lost ~30 lbs with intentional diet changes.  Pt denies nutrition-related concerns or needs.  No nutrition interventions warranted at this time. If nutrition issues arise, please consult RD.   Loyce Dys, MS RD LDN Clinical Inpatient Dietitian Pager: [phone] Weekend/After hours pager: [phone]

## 2012-03-15 NOTE — Progress Notes (Addendum)
Pt beta blocker was not reordered prior to surgery. Patient take atenolol 50 mg po daily. Rn notified Dr. Ezzard Standing. Was instructed to send patient down without giving so that surgery would not be held. Anesthesiologist (Massagee)with give dose when down stairs.

## 2012-03-15 NOTE — Op Note (Signed)
03/14/2012 - 03/15/2012  2:02 PM  PATIENT:  Cody Harris, 47 y.o., male, MRN: [id number]  PREOP DIAGNOSIS:  incarcerated hernia inguinal  POSTOP DIAGNOSIS:   Indirect left inguinal hernia (but primarily preperitoneal fat), scrotal edema (L scrotum > R scrotum) of uncertain etiology  PROCEDURE:   Procedure(s):  Open left HERNIA REPAIR INGUINAL   SURGEON:   Ovidio Kin, M.D.  ANESTHESIA:   general  Anesthesiologist: Josepha Pigg, MD; Phillips Grout, MD CRNA: Edmonia Caprio, CRNA; Rogelia Boga, CRNA  General  EBL:  Minimal  ml  LOCAL MEDICATIONS USED:   30 cc 1/4%   SPECIMEN:   None  COUNTS CORRECT:  YES  INDICATIONS FOR PROCEDURE:  Cody Harris is a 47 y.o. (DOB: 1965-11-27) white  male whose primary care physician is Pcp Not In System and comes for incarcerated left inguinal hernia.   The indications and risks of the surgery were explained to the patient.  The risks include, but are not limited to, infection, bleeding, and nerve injury.  Operative Note: The patient was taken to room number 1 at [facility].  He underwent a general anesthesia.    A time out was held and the surgical checklist run.  His lower abdomen was shaved and then prepped with chloroprep.  A left inguinal incision was made through the subcutaneous fat to the external oblique fascia.  The external ring was opened and the cord structures encircled with a penrose drain.  The patient had a indirect left inguinal hernia that was preperitoneal fat through the internal ring.  This did not correlate with a typical hernia that goes into the scrotum.  I had the scrotum prepped out and manipulated the scrotum and could not see where his scrotal edema was due to a hernia.  I then went ahead with a hernia repair.  The inguinal floor was repaired with a 3 x 6 inch piece of Ultrapro mesh.  The mesh was cut to fit the inguinal floor.  The mesh was sewn in place with interrupted 0 Novafil suture.  A key  hole was made for the internal ring.  The mesh lay flat.  The inguinal floor was covered and the internal ring recreated.  The cord structures were returned to a normal location.  The external oblique was closed with a 3-0 vicryl.  The fascia and subcutaneous tissues were infiltrated with approx 30 cc of 1/4% marcaine plain.  The skin was closed with 5-0 monocryl and painted with Dermabond. The sponge and needle count were correct at the end of the case.    I ultrasounded the scrotum at the end of the case.  I did not see a hydrocele.  I visualized both testicle (but did not have flow).  He has edematous tissues by ultrasound.  The patient was transported to the recovery room in good condition.  I'll plan to keep the patient overnight.  Because of the unexplained scrotal edema, I've asked Dr. Kathie Rhodes. MacDiarmid to see the patient.  Ovidio Kin, MD, [facility] Pager: [phone] Office phone:  [phone]

## 2012-03-16 ENCOUNTER — Inpatient Hospital Stay (HOSPITAL_COMMUNITY): Payer: Self-pay

## 2012-03-16 MED ORDER — HYDROCODONE-ACETAMINOPHEN 5-325 MG PO TABS: 1.0000 | ORAL_TABLET | ORAL | Status: DC | PRN

## 2012-03-16 NOTE — Progress Notes (Signed)
Scrotal edema looks the same D/c home after u/sound today Will f/up

## 2012-03-16 NOTE — Discharge Summary (Signed)
Patient ID: Cody Harris MRN: [id number] DOB/AGE: 10/25/1965 47 y.o.  Admit date: 03/14/2012 Discharge date: 03/16/2012  Procedures: open left inguinal hernia repair - D. Ezzard Standing - 03/15/2012  Consults: urology - Dr. Mina Marble  Reason for Admission: 47 year old Caucasian male with a history of hypertension who states he has had a known left inguinal hernia for several years Comes to the ER this evening because his hernia suddenly enlarged in size this afternoon. He states that his hernia in his scrotum got enlarged about A month or so ago after moving a large cast iron tub. However he did not have any significant discomfort at that time. It may cause him occasional ache or discomfort but nothing of any significance. He denies any significant history of burning or stinging pain in his groin. This afternoon he went snow sledding with his children and afterwards he felt that his hernia had significantly increased in size. He also noticed some more discomfort at the base of his scrotum. He states that it also felt different as well he had a bowel movement earlier today. He denies any fever, chills, nausea, vomiting, diarrhea or constipation. He denies any trouble urinating. He has never had any prior abdominal surgery.  Admission Diagnoses:  Incarcerated Left inguinal hernia, chronic  HTN  H/o Ventricular tachycardia  Hospital Course: the patient was admitted and taken to the operating room where he was found to have a small left inguinal hernia that was repaired.  He was found to have a large scrotum that was not secondary to a hernia.  An intraoperative U/S was completed which looked mostly like edema inside the scrotum.  Urology was consulted and a formal scrotal ultrasound was ordered and the patient was started on prophylactic Keflex for 5 days.  The patient was otherwise stable on POD#1 for dc home as he was tolerating a solid diet and his pain was well controlled.  PE: Abd: soft, tender over  left inguinal incision, +BS, ND GU: scrotum with significant edema still  Discharge Diagnoses:  1.  Left indirect inguinal hernia, s/p repair 2.  Scrotal edema, unknown etiology 3.  Left scrotal hydrocele on [place]  Discharge Medications:   Medication List    TAKE these medications       atenolol 50 MG tablet  Commonly known as:  TENORMIN  Take 50 mg by mouth every morning.     HYDROcodone-acetaminophen 5-325 MG per tablet  Commonly known as:  NORCO/VICODIN  Take 1-2 tablets by mouth every 4 (four) hours as needed.     lisinopril 20 MG tablet  Commonly known as:  PRINIVIL,ZESTRIL  Take 20 mg by mouth every morning.      Keflex 500mg  TID for 5 days  Discharge Instructions:     Follow-up Information   Follow up with Donell Tomkins H, MD. Schedule an appointment as soon as possible for a visit in 3 weeks.   Contact information:   [address] [place] [address] [phone]       Follow up with MACDIARMID,SCOTT A, MD. (his office will call you)    Contact information:   [address] FLO[address] [facility] [place] [address] [phone]       Signed: Letha Cape 03/16/2012, 9:11 AM  Agree with above. By scrotal US today, he has a moderate left scrotal hydrocele.  Not sure how this explains his scrotal edema.  Ovidio Kin, MD, [facility] Pager: [phone] Office  phone:  [phone]

## 2012-03-20 ENCOUNTER — Encounter (HOSPITAL_COMMUNITY): Payer: Self-pay | Admitting: Surgery

## 2012-03-25 ENCOUNTER — Telehealth (INDEPENDENT_AMBULATORY_CARE_PROVIDER_SITE_OTHER): Payer: Self-pay

## 2012-03-25 NOTE — Telephone Encounter (Signed)
Appt 04/04/12@ 230p wife aware

## 2012-04-04 ENCOUNTER — Ambulatory Visit (INDEPENDENT_AMBULATORY_CARE_PROVIDER_SITE_OTHER): Payer: Self-pay | Admitting: Surgery

## 2012-04-04 ENCOUNTER — Encounter (INDEPENDENT_AMBULATORY_CARE_PROVIDER_SITE_OTHER): Payer: Self-pay | Admitting: Surgery

## 2012-04-04 VITALS — BP 132/84 | HR 64 | Temp 98.4°F | Resp 12 | Ht 76.0 in | Wt 248.0 lb

## 2012-04-04 DIAGNOSIS — Z9889 Other specified postprocedural states: Secondary | ICD-10-CM

## 2012-04-04 DIAGNOSIS — Z8719 Personal history of other diseases of the digestive system: Secondary | ICD-10-CM | POA: Insufficient documentation

## 2012-04-04 NOTE — Progress Notes (Signed)
[facility]  Cody Kin, MD,  FACS [address] [place]  [address] [place], [place]    [address] Phone:  [phone] FAX:  [phone]   Re:   Cody Harris DOB:   1965/11/15 MRN:   [id number]  ASSESSMENT AND PLAN: 1.  Indirect left inguinal hernia.  Repaired 03/15/2012 - Cody Harris  Has done well.  2.  Scrotal edema with hydrocele  Sees by Dr. Kathie Rhodes. Harris  To consider surgery in the future.   3.  Hypertension 4.  Intentional weight loss of about 50 pounds  He thinks he may be able to cut back on hypertensive meds.  HISTORY OF PRESENT ILLNESS: Chief Complaint  Patient presents with  . Follow-up    po hernia    Cody Harris is a 47 y.o. (DOB: February 09, 1965)  white  male who is a patient of Pcp Not In System and comes to me today for follow up of left inguinal inguinal hernia and left scrotal edema. Has done well.  Some numbness below incision.  Scrotum shows less swelling and looks more like a hydrocele to me.  Cody Harris has seen Dr. Sherron Harris who has talked to him about removing the hydrocele when he is recovered from the hernia surgery.  Social History: Works in Stage [employer]. PHYSICAL EXAM: BP 132/84  Pulse 64  Temp(Src) 98.4 F (36.9 C) (Temporal)  Resp 12  Ht 6\' 4"  (1.93 m)  Wt 248 lb (112.492 kg)  BMI 30.2 kg/m2  Abdomen:  Benign Groin:  Left inguinal incision looks good.  Lef scroum consistent with 6 to 8 cm hydrocele   DATA REVIEWED: Epic records  Cody Kin, MD, FACS Office:  [phone]

## 2012-04-11 ENCOUNTER — Other Ambulatory Visit: Payer: Self-pay | Admitting: *Deleted

## 2012-04-11 MED ORDER — LISINOPRIL 20 MG PO TABS: 20.0000 mg | ORAL_TABLET | Freq: Every morning | ORAL | Status: DC

## 2012-05-28 ENCOUNTER — Other Ambulatory Visit: Payer: Self-pay | Admitting: *Deleted

## 2012-05-28 MED ORDER — ATENOLOL 50 MG PO TABS: 50.0000 mg | ORAL_TABLET | Freq: Every morning | ORAL | Status: DC

## 2012-06-11 ENCOUNTER — Other Ambulatory Visit: Payer: Self-pay | Admitting: *Deleted

## 2012-06-11 MED ORDER — LISINOPRIL 20 MG PO TABS: 20.0000 mg | ORAL_TABLET | Freq: Every morning | ORAL | Status: DC

## 2012-07-15 ENCOUNTER — Other Ambulatory Visit: Payer: Self-pay | Admitting: Emergency Medicine

## 2012-07-15 MED ORDER — LISINOPRIL 20 MG PO TABS: 20.0000 mg | ORAL_TABLET | Freq: Every morning | ORAL | Status: DC

## 2012-07-26 ENCOUNTER — Other Ambulatory Visit: Payer: Self-pay | Admitting: *Deleted

## 2012-07-26 MED ORDER — ATENOLOL 50 MG PO TABS: 50.0000 mg | ORAL_TABLET | Freq: Every morning | ORAL | Status: DC

## 2012-08-07 ENCOUNTER — Encounter: Payer: Self-pay | Admitting: *Deleted

## 2012-08-08 ENCOUNTER — Ambulatory Visit (INDEPENDENT_AMBULATORY_CARE_PROVIDER_SITE_OTHER): Payer: Self-pay | Admitting: Internal Medicine

## 2012-08-08 ENCOUNTER — Encounter: Payer: Self-pay | Admitting: Internal Medicine

## 2012-08-08 VITALS — BP 154/94 | HR 60 | Ht 75.0 in | Wt 242.6 lb

## 2012-08-08 DIAGNOSIS — G4730 Sleep apnea, unspecified: Secondary | ICD-10-CM

## 2012-08-08 DIAGNOSIS — I10 Essential (primary) hypertension: Secondary | ICD-10-CM

## 2012-08-08 DIAGNOSIS — I472 Ventricular tachycardia: Secondary | ICD-10-CM

## 2012-08-08 NOTE — Patient Instructions (Signed)
Your physician has recommended that you have a sleep study. This test records several body functions during sleep, including: brain activity, eye movement, oxygen and carbon dioxide blood levels, heart rate and rhythm, breathing rate and rhythm, the flow of air through your mouth and nose, snoring, body muscle movements, and chest and belly movement.  Your physician wants you to follow-up in: 1 year with Dr. Graciela Husbands. You will receive a reminder letter in the mail two months in advance. If you don't receive a letter, please call our office to schedule the follow-up appointment.

## 2012-08-08 NOTE — Assessment & Plan Note (Signed)
No recurrent palpitations on atenolol

## 2012-08-08 NOTE — Progress Notes (Signed)
Patient Care Team: Pcp Not In System as PCP - General   HPI  Cody Harris is a 47 y.o. 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 He's been managed with beta blockers.  He has also had a history of presumed white coat hypertension .Marland Kitchen Blood pressures at home yesterday and today were in the 130/80-85 range.   Is continue to work on weight loss.  He is snoring with daytime somnolence  Past Medical History  Diagnosis Date  . Ventricular tachycardia     Left ventricular-Belhassen  . White coat hypertension     Past Surgical History  Procedure Laterality Date  . Inguinal hernia repair Left 03/15/2012    Procedure: HERNIA REPAIR INGUINAL INCARCERATED;  Surgeon: Kandis Cocking, MD;  Location: [facility] OR;  Service: General;  Laterality: Left;    Current Outpatient Prescriptions  Medication Sig Dispense Refill  . atenolol (TENORMIN) 50 MG tablet Take 1 tablet (50 mg total) by mouth every morning.  30 tablet  0  . lisinopril (PRINIVIL,ZESTRIL) 20 MG tablet Take 1 tablet (20 mg total) by mouth every morning.  30 tablet  0   No current facility-administered medications for this visit.    Allergies  Allergen Reactions  . Zithromax (Azithromycin) Other (See Comments)    Severe stomach ulcer    Review of Systems negative except from HPI and PMH  Physical Exam BP 154/94  Pulse 60  Ht 6\' 3"  (1.905 m)  Wt 242 lb 9.6 oz (110.043 kg)  BMI 30.32 kg/m2 Well developed and nourished in no acute distress HENT normal Neck supple with JVP-flat Clear Regular rate and rhythm, no murmurs or gallops Abd-soft with active BS No Clubbing cyanosis edema Skin-warm and dry A & Oriented  Grossly normal sensory and motor function  ECG demonstrates sinus at 66 intervals 16/10/42   Assessment and  Plan

## 2012-08-08 NOTE — Assessment & Plan Note (Signed)
Blood pressure is reasonably controlled. He is a significant snorer with daytime somnolence. We'll obtain a sleep study

## 2012-08-12 ENCOUNTER — Other Ambulatory Visit: Payer: Self-pay | Admitting: *Deleted

## 2012-08-12 MED ORDER — LISINOPRIL 20 MG PO TABS: 20.0000 mg | ORAL_TABLET | Freq: Every morning | ORAL | Status: DC

## 2012-08-28 ENCOUNTER — Other Ambulatory Visit: Payer: Self-pay | Admitting: *Deleted

## 2012-08-28 MED ORDER — LISINOPRIL 20 MG PO TABS: 20.0000 mg | ORAL_TABLET | Freq: Every morning | ORAL | Status: DC

## 2012-08-28 MED ORDER — ATENOLOL 50 MG PO TABS: 50.0000 mg | ORAL_TABLET | Freq: Every morning | ORAL | Status: DC

## 2012-09-08 ENCOUNTER — Encounter (HOSPITAL_BASED_OUTPATIENT_CLINIC_OR_DEPARTMENT_OTHER): Payer: BC Managed Care – PPO

## 2012-12-05 ENCOUNTER — Other Ambulatory Visit: Payer: Self-pay

## 2012-12-31 ENCOUNTER — Other Ambulatory Visit: Payer: Self-pay | Admitting: *Deleted

## 2012-12-31 MED ORDER — ATENOLOL 50 MG PO TABS: 50.0000 mg | ORAL_TABLET | Freq: Every morning | ORAL | Status: DC

## 2013-02-24 ENCOUNTER — Other Ambulatory Visit: Payer: Self-pay

## 2013-02-24 MED ORDER — LISINOPRIL 20 MG PO TABS: 20.0000 mg | ORAL_TABLET | Freq: Every morning | ORAL | Status: DC

## 2013-06-26 ENCOUNTER — Other Ambulatory Visit: Payer: Self-pay | Admitting: *Deleted

## 2013-06-26 MED ORDER — LISINOPRIL 20 MG PO TABS: 20.0000 mg | ORAL_TABLET | Freq: Every morning | ORAL | Status: DC

## 2013-07-11 ENCOUNTER — Other Ambulatory Visit: Payer: Self-pay | Admitting: *Deleted

## 2013-07-11 MED ORDER — ATENOLOL 50 MG PO TABS: 50.0000 mg | ORAL_TABLET | Freq: Every morning | ORAL | Status: DC

## 2013-08-11 ENCOUNTER — Other Ambulatory Visit: Payer: Self-pay | Admitting: *Deleted

## 2013-08-11 MED ORDER — ATENOLOL 50 MG PO TABS: 50.0000 mg | ORAL_TABLET | Freq: Every morning | ORAL | Status: DC

## 2013-08-21 ENCOUNTER — Other Ambulatory Visit: Payer: Self-pay

## 2013-08-21 MED ORDER — LISINOPRIL 20 MG PO TABS: 20.0000 mg | ORAL_TABLET | Freq: Every morning | ORAL | Status: DC

## 2013-09-10 ENCOUNTER — Encounter: Payer: Self-pay | Admitting: *Deleted

## 2013-09-18 ENCOUNTER — Encounter: Payer: BC Managed Care – PPO | Admitting: Internal Medicine

## 2013-09-19 ENCOUNTER — Encounter: Payer: Self-pay | Admitting: Internal Medicine

## 2013-10-14 ENCOUNTER — Other Ambulatory Visit: Payer: Self-pay | Admitting: *Deleted

## 2013-10-14 MED ORDER — ATENOLOL 50 MG PO TABS: 50.0000 mg | ORAL_TABLET | Freq: Every morning | ORAL | Status: DC

## 2013-10-20 ENCOUNTER — Other Ambulatory Visit: Payer: Self-pay

## 2013-10-20 MED ORDER — LISINOPRIL 20 MG PO TABS: 20.0000 mg | ORAL_TABLET | Freq: Every morning | ORAL | Status: DC

## 2013-11-14 ENCOUNTER — Other Ambulatory Visit: Payer: Self-pay | Admitting: *Deleted

## 2013-11-14 MED ORDER — LISINOPRIL 20 MG PO TABS: 20.0000 mg | ORAL_TABLET | Freq: Every morning | ORAL | Status: DC

## 2013-11-14 MED ORDER — ATENOLOL 50 MG PO TABS
50.0000 mg | ORAL_TABLET | Freq: Every morning | ORAL | Status: DC
Start: 2013-11-14 — End: 2013-12-11

## 2013-12-11 ENCOUNTER — Other Ambulatory Visit: Payer: Self-pay | Admitting: *Deleted

## 2013-12-11 MED ORDER — ATENOLOL 50 MG PO TABS: 50.0000 mg | ORAL_TABLET | Freq: Every morning | ORAL | Status: DC

## 2013-12-18 ENCOUNTER — Ambulatory Visit (INDEPENDENT_AMBULATORY_CARE_PROVIDER_SITE_OTHER): Payer: Self-pay | Admitting: Internal Medicine

## 2013-12-18 VITALS — BP 164/112 | HR 63 | Ht 75.5 in | Wt 260.0 lb

## 2013-12-18 DIAGNOSIS — I10 Essential (primary) hypertension: Secondary | ICD-10-CM

## 2013-12-18 DIAGNOSIS — I472 Ventricular tachycardia, unspecified: Secondary | ICD-10-CM

## 2013-12-18 MED ORDER — LISINOPRIL-HYDROCHLOROTHIAZIDE 20-12.5 MG PO TABS: 1.0000 | ORAL_TABLET | Freq: Every day | ORAL | Status: DC

## 2013-12-18 NOTE — Patient Instructions (Signed)
Your physician has recommended you make the following change in your medication:  1) STOP Lisinopril 2) START Lisinopril-Hydrochlorothiazide 20/12.5 mg daily (take 1 tablet daily)  Your physician recommends that you return for lab work in: 2 weeks for BMET  Your physician wants you to follow-up in: 6 months with Dr. Graciela HusbandsKlein. You will receive a reminder letter in the mail two months in advance. If you don't receive a letter, please call our office to schedule the follow-up appointment.

## 2013-12-18 NOTE — Progress Notes (Signed)
Patient Care Team: Pcp Not In System as PCP - General   HPI  Virgina OrganBobby J Pridgen is a 48 y.o. 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 He's been managed with beta blockers.  He has also had a history of presumed white coat hypertension .Marland Kitchen. Blood pressures at home yesterday and today were in the 130/80-85 range.   Is continue to work on weight loss.  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 Ventricular tachycardia     Left ventricular-Belhassen  . White coat hypertension     Past Surgical History  Procedure Laterality Date  . Inguinal hernia repair Left 03/15/2012    Procedure: HERNIA REPAIR INGUINAL INCARCERATED;  Surgeon: Kandis Cockingavid H Newman, MD;  [facility];  Service: General;  Laterality: Left;    Current Outpatient Prescriptions  Medication Sig Dispense Refill  . atenolol (TENORMIN) 50 MG tablet Take 1 tablet (50 mg total) by mouth every morning. 7 tablet 0  . lisinopril (PRINIVIL,ZESTRIL) 20 MG tablet Take 1 tablet (20 mg total) by mouth every morning. 30 tablet 5   No current facility-administered medications for this visit.    Allergies  Allergen Reactions  . Zithromax [Azithromycin] Other (See Comments)    Severe stomach ulcer    Review of Systems negative except from HPI and PMH  Physical Exam BP 164/112 mmHg  Pulse 63  Ht 6' 3.5" (1.918 m)  Wt 117.935 kg (260 lb)  BMI 32.06 kg/m2 Well developed and nourished in no acute distress HENT normal Neck supple with JVP-flat Clear Regular rate and rhythm, no murmurs or gallops Abd-soft with active BS No Clubbing cyanosis edema Skin-warm and dry A & Oriented  Grossly normal sensory and motor function  ECG demonstrates sinus at 66 intervals 16/10/42   Assessment and  Plan  Hypertension  Ventricular tachycardia  --Belhassen  Doing well from VT point of view  Will add HCT to lisinopril and willneed recheck of BMET in two weeks (labs normal 2/14)

## 2013-12-19 ENCOUNTER — Other Ambulatory Visit: Payer: Self-pay

## 2013-12-19 MED ORDER — ATENOLOL 50 MG PO TABS: 50.0000 mg | ORAL_TABLET | Freq: Every morning | ORAL | Status: DC

## 2014-01-01 ENCOUNTER — Other Ambulatory Visit (INDEPENDENT_AMBULATORY_CARE_PROVIDER_SITE_OTHER): Payer: BC Managed Care – PPO | Admitting: *Deleted

## 2014-01-01 DIAGNOSIS — I472 Ventricular tachycardia, unspecified: Secondary | ICD-10-CM

## 2014-01-01 DIAGNOSIS — I10 Essential (primary) hypertension: Secondary | ICD-10-CM

## 2014-01-01 LAB — BASIC METABOLIC PANEL
BUN: 12 mg/dL (ref 6–23)
CO2: 28 meq/L (ref 19–32)
Calcium: 8.8 mg/dL (ref 8.4–10.5)
Chloride: 101 mEq/L (ref 96–112)
Creatinine, Ser: 1 mg/dL (ref 0.4–1.5)
GFR: 85.31 mL/min (ref >60.00)
GLUCOSE: 116 mg/dL — AB (ref 70–99)
Potassium: 3.6 mEq/L (ref 3.5–5.1)
SODIUM: 138 meq/L (ref 135–145)

## 2014-01-02 ENCOUNTER — Telehealth: Payer: Self-pay | Admitting: *Deleted

## 2014-01-02 NOTE — Telephone Encounter (Signed)
pt's wife notified about lab results with verbal understanding 

## 2014-07-08 IMAGING — CR DG ABDOMEN ACUTE W/ 1V CHEST
4 series · 4 of 4 positions shown · non-contrast
Comparison: None.

CLINICAL DATA: Lower abdominal pain.

ACUTE ABDOMEN SERIES (ABDOMEN 2 VIEW & CHEST 1 VIEW)

[w chest pa]
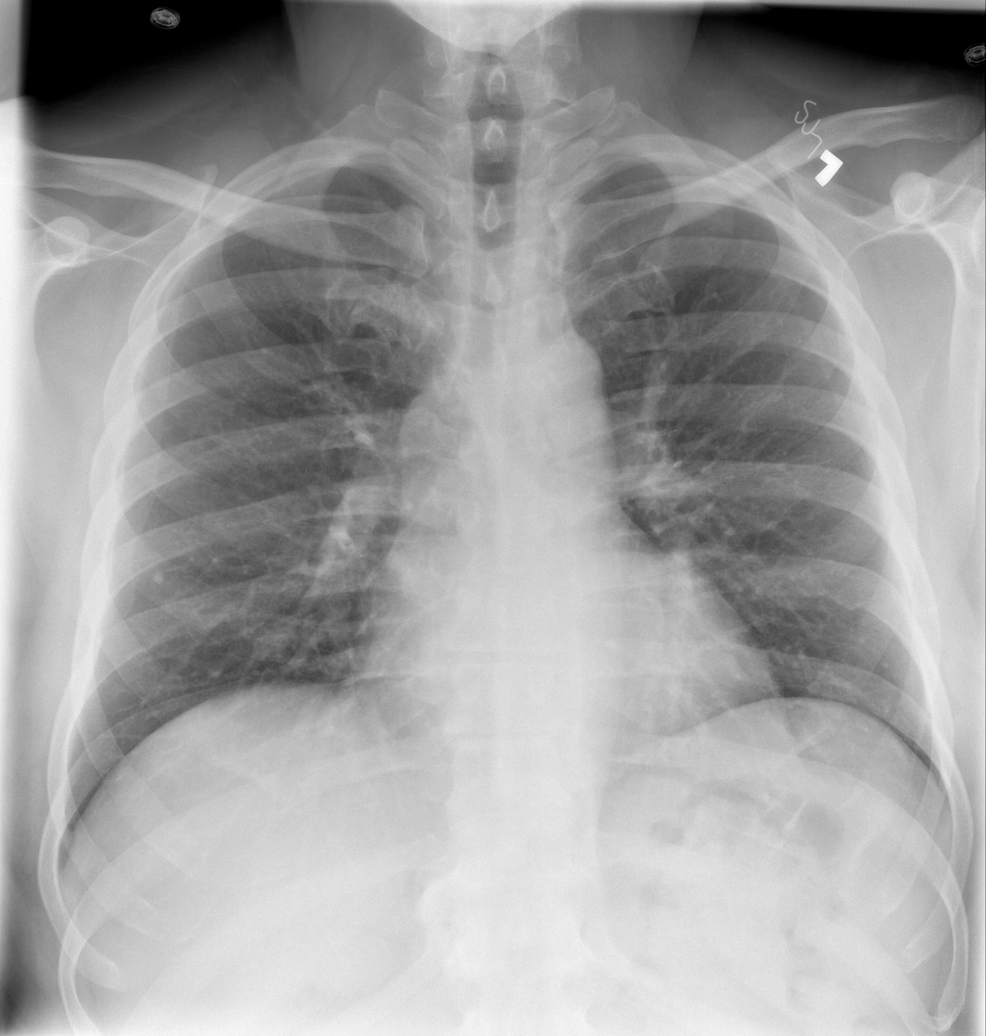

[w abdomen upright]
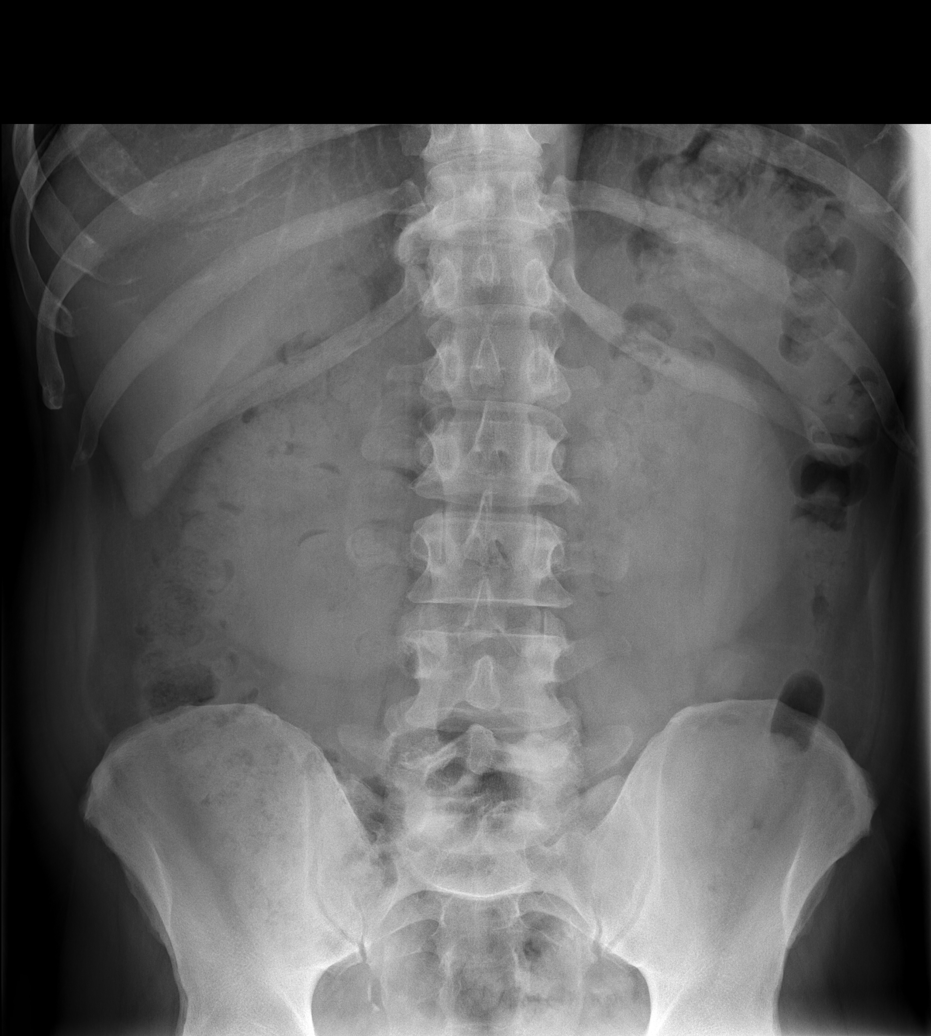

[t abdomen supine (1 of 2)]
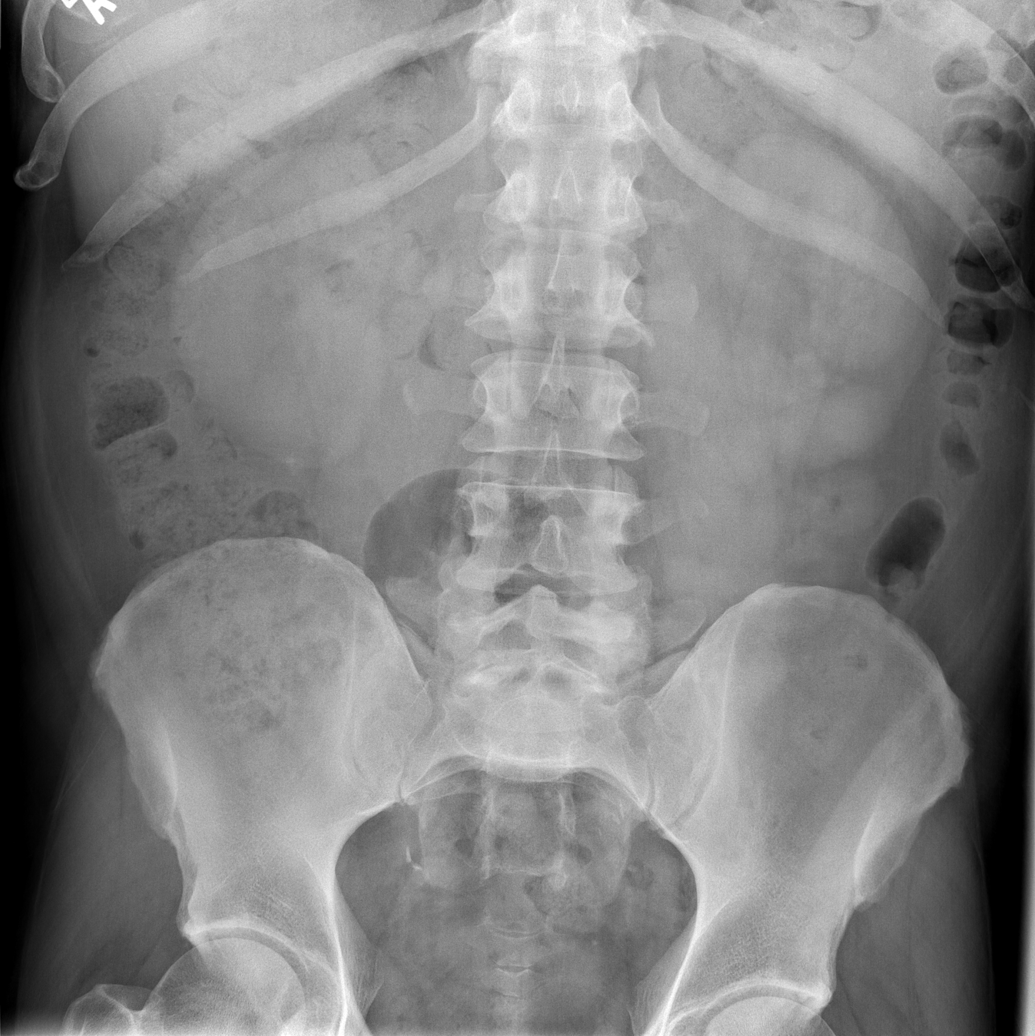

[t abdomen supine (2 of 2)]
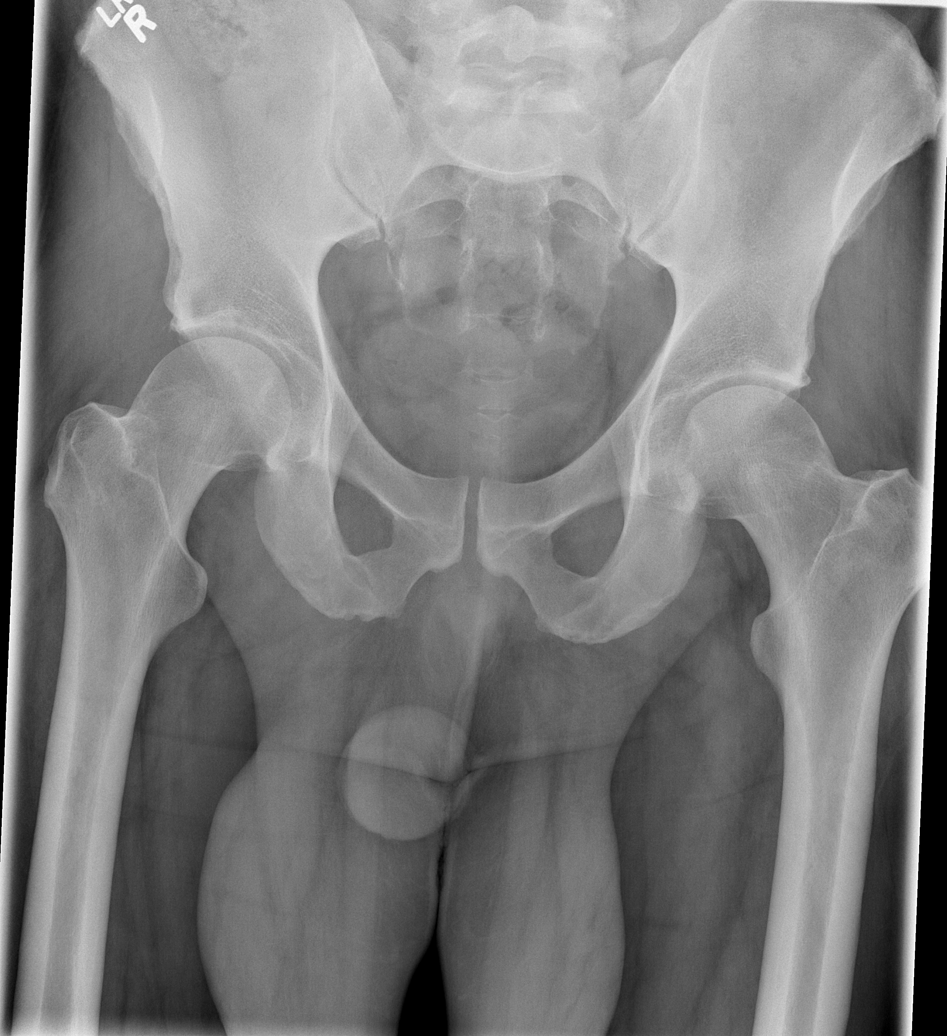

[4 of 4 positions shown; findings below may reference images not displayed]

FINDINGS: The lungs are well-aerated and clear.  There is no
evidence of focal opacification, pleural effusion or pneumothorax.
The cardiomediastinal silhouette is within normal limits.

The visualized bowel gas pattern is unremarkable.  Scattered stool
and air are seen within the colon; there is no evidence of small
bowel dilatation to suggest obstruction.  No free intra-abdominal
air is identified on the provided upright view.

No acute osseous abnormalities are seen; the sacroiliac joints are
unremarkable in appearance.

The clinically described hernia is not well characterized on
radiograph.
IMPRESSION: 1.  Unremarkable bowel gas pattern; no free intra-abdominal air
seen.
2.  No acute cardiopulmonary process identified.
3.  The clinically described hernia is not well characterized on
radiograph.

## 2014-07-17 ENCOUNTER — Other Ambulatory Visit: Payer: Self-pay | Admitting: *Deleted

## 2014-07-17 MED ORDER — ATENOLOL 50 MG PO TABS: 50.0000 mg | ORAL_TABLET | Freq: Every morning | ORAL | Status: DC

## 2014-07-17 MED ORDER — LISINOPRIL-HYDROCHLOROTHIAZIDE 20-12.5 MG PO TABS: 1.0000 | ORAL_TABLET | Freq: Every day | ORAL | Status: DC

## 2015-02-12 ENCOUNTER — Other Ambulatory Visit: Payer: Self-pay | Admitting: Internal Medicine

## 2015-02-15 ENCOUNTER — Other Ambulatory Visit: Payer: Self-pay | Admitting: *Deleted

## 2015-02-15 ENCOUNTER — Telehealth: Payer: Self-pay | Admitting: Internal Medicine

## 2015-02-15 MED ORDER — LISINOPRIL-HYDROCHLOROTHIAZIDE 20-12.5 MG PO TABS: 1.0000 | ORAL_TABLET | Freq: Every day | ORAL | Status: DC

## 2015-02-15 MED ORDER — ATENOLOL 50 MG PO TABS: 50.0000 mg | ORAL_TABLET | Freq: Every morning | ORAL | Status: DC

## 2015-02-15 NOTE — Telephone Encounter (Signed)
Rx's sent in. °

## 2015-02-15 NOTE — Telephone Encounter (Signed)
NewMessage  Pt wife requested for refill to be sent in as soon as possibl- pt has appt w/ Dr Graciela HusbandsKlein 04/19/15  *STAT* If patient is at the pharmacy, call can be transferred to refill team.   1. Which medications need to be refilled? (please list name of each medication and dose if known) atenolol 50 mg, lisinopril 20 mg  2. Which pharmacy/location (including street and city if local pharmacy) is medication to be sent to?[facility] [address]  3. Do they need a 30 day or 90 day supply? 30

## 2015-04-19 ENCOUNTER — Ambulatory Visit: Payer: Self-pay | Admitting: Internal Medicine

## 2015-05-13 ENCOUNTER — Other Ambulatory Visit: Payer: Self-pay | Admitting: *Deleted

## 2015-05-13 MED ORDER — ATENOLOL 50 MG PO TABS: 50.0000 mg | ORAL_TABLET | Freq: Every morning | ORAL | Status: DC

## 2015-05-13 MED ORDER — LISINOPRIL-HYDROCHLOROTHIAZIDE 20-12.5 MG PO TABS: 1.0000 | ORAL_TABLET | Freq: Every day | ORAL | Status: DC

## 2015-05-13 NOTE — Telephone Encounter (Signed)
Patient has an appt 07/02/2015. Needs medication to get him to that point.

## 2015-07-02 ENCOUNTER — Ambulatory Visit (INDEPENDENT_AMBULATORY_CARE_PROVIDER_SITE_OTHER): Payer: Self-pay | Admitting: Internal Medicine

## 2015-07-02 ENCOUNTER — Encounter: Payer: Self-pay | Admitting: Internal Medicine

## 2015-07-02 VITALS — BP 154/98 | HR 62 | Ht 75.0 in | Wt 258.4 lb

## 2015-07-02 DIAGNOSIS — I472 Ventricular tachycardia, unspecified: Secondary | ICD-10-CM

## 2015-07-02 DIAGNOSIS — I10 Essential (primary) hypertension: Secondary | ICD-10-CM

## 2015-07-02 LAB — BASIC METABOLIC PANEL
BUN: 15 mg/dL (ref 7–25)
CALCIUM: 9.1 mg/dL (ref 8.6–10.3)
CO2: 25 mmol/L (ref 20–31)
CREATININE: 0.89 mg/dL (ref 0.70–1.33)
Chloride: 105 mmol/L (ref 98–110)
GLUCOSE: 98 mg/dL (ref 65–99)
Potassium: 4.5 mmol/L (ref 3.5–5.3)
Sodium: 139 mmol/L (ref 135–146)

## 2015-07-02 NOTE — Progress Notes (Signed)
Patient Care Team: Pcp Not In System as PCP - General   HPI  Cody Harris is a 50 y.o. 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 He's been managed with beta blockers.  He has also had a history of presumed white coat hypertension .Marland Kitchen.   Is continue to work on weight loss.  He denies snoring with daytime somnolence     Past Medical History  Diagnosis Date  . Ventricular tachycardia (HCC)     Left ventricular-Belhassen  . White coat hypertension     Past Surgical History  Procedure Laterality Date  . Inguinal hernia repair Left 03/15/2012    Procedure: HERNIA REPAIR INGUINAL INCARCERATED;  Surgeon: Kandis Cockingavid H Newman, MD;  [facility];  Service: General;  Laterality: Left;    Current Outpatient Prescriptions  Medication Sig Dispense Refill  . atenolol (TENORMIN) 50 MG tablet Take 1 tablet (50 mg total) by mouth every morning. 30 tablet 2  . lisinopril-hydrochlorothiazide (ZESTORETIC) 20-12.5 MG tablet Take 1 tablet by mouth daily. 30 tablet 2   No current facility-administered medications for this visit.    Allergies  Allergen Reactions  . Zithromax [Azithromycin] Other (See Comments)    Severe stomach ulcer    Review of Systems negative except from HPI and PMH  Physical Exam BP 154/98 mmHg  Pulse 62  Ht 6\' 3"  (1.905 m)  Wt 258 lb 6.4 oz (117.209 kg)  BMI 32.30 kg/m2 Well developed and nourished in no acute distress HENT normal Neck supple with JVP-flat Clear Regular rate and rhythm, no murmurs or gallops Abd-soft with active BS No Clubbing cyanosis edema Skin-warm and dry A & Oriented  Grossly normal sensory and motor function  ECG demonstrates sinus at 66 intervals 16/10/42   Assessment and  Plan  Hypertension  Ventricular tachycardia --Belhassen  Doing well from VT point of view  Will continue  lisinopril HCT  Will check metabolic profile   ce

## 2015-07-02 NOTE — Patient Instructions (Signed)
Medication Instructions: - Your physician recommends that you continue on your current medications as directed. Please refer to the Current Medication list given to you today.  Labwork: - Your physician recommends that you have lab work today:BMP  Procedures/Testing: - none  Follow-Up: - Your physician wants you to follow-up in: 1 year with Dr. Klein. You will receive a reminder letter in the mail two months in advance. If you don't receive a letter, please call our office to schedule the follow-up appointment.  Any Additional Special Instructions Will Be Listed Below (If Applicable).     If you need a refill on your cardiac medications before your next appointment, please call your pharmacy.   

## 2015-07-06 ENCOUNTER — Telehealth: Payer: Self-pay | Admitting: Internal Medicine

## 2015-07-06 NOTE — Telephone Encounter (Signed)
Mr.Geissinger is retuning your call . Thanks

## 2015-07-06 NOTE — Telephone Encounter (Signed)
I attempted to call the patient. I left a message on his voice mail that his labs were all normal.

## 2015-08-09 ENCOUNTER — Other Ambulatory Visit: Payer: Self-pay | Admitting: Internal Medicine

## 2015-10-28 NOTE — Progress Notes (Signed)
This encounter was created in error - please disregard.

## 2016-07-03 ENCOUNTER — Other Ambulatory Visit: Payer: Self-pay | Admitting: Internal Medicine

## 2016-08-06 ENCOUNTER — Other Ambulatory Visit: Payer: Self-pay | Admitting: Internal Medicine

## 2016-08-16 ENCOUNTER — Telehealth: Payer: Self-pay | Admitting: Internal Medicine

## 2016-08-16 MED ORDER — LISINOPRIL-HYDROCHLOROTHIAZIDE 20-12.5 MG PO TABS: 1.0000 | ORAL_TABLET | Freq: Every day | ORAL | 3 refills | Status: DC

## 2016-08-16 NOTE — Telephone Encounter (Signed)
Pt's medication was sent to pt's pharmacy as requested. Confirmation received.  °

## 2016-08-16 NOTE — Telephone Encounter (Signed)
New message     *STAT* If patient is at the pharmacy, call can be transferred to refill team.   1. Which medications need to be refilled? (please list name of each medication and dose if known) lisinopril hctz 20-12.5 mg, atenolol 50 mg  2. Which pharmacy/location (including street and city if local pharmacy) is medication to be sent to? [facility] [address]   3. Do they need a 30 day or 90 day supply? 30 day

## 2016-08-22 ENCOUNTER — Other Ambulatory Visit: Payer: Self-pay | Admitting: Internal Medicine

## 2016-11-22 ENCOUNTER — Telehealth: Payer: Self-pay | Admitting: Internal Medicine

## 2016-11-22 MED ORDER — ATENOLOL 50 MG PO TABS: 50.0000 mg | ORAL_TABLET | Freq: Every morning | ORAL | 2 refills | Status: DC

## 2016-11-22 NOTE — Telephone Encounter (Signed)
New message     *STAT* If patient is at the pharmacy, call can be transferred to refill team.   1. Which medications need to be refilled? (please list name of each medication and dose if known) atenolol   2. Which pharmacy/location (including street and city if local pharmacy) is medication to be sent to? [facility] on [address]  3. Do they need a 30 day or 90 day supply? 30 day

## 2016-11-24 ENCOUNTER — Ambulatory Visit: Payer: Self-pay | Admitting: Internal Medicine

## 2016-12-19 ENCOUNTER — Other Ambulatory Visit: Payer: Self-pay

## 2016-12-19 MED ORDER — LISINOPRIL-HYDROCHLOROTHIAZIDE 20-12.5 MG PO TABS: 1.0000 | ORAL_TABLET | Freq: Every day | ORAL | 1 refills | Status: DC

## 2017-02-02 ENCOUNTER — Encounter: Payer: Self-pay | Admitting: Internal Medicine

## 2017-02-02 ENCOUNTER — Ambulatory Visit (INDEPENDENT_AMBULATORY_CARE_PROVIDER_SITE_OTHER): Payer: Self-pay | Admitting: Internal Medicine

## 2017-02-02 ENCOUNTER — Telehealth: Payer: Self-pay

## 2017-02-02 VITALS — BP 130/86 | HR 59 | Ht 75.0 in | Wt 270.0 lb

## 2017-02-02 DIAGNOSIS — I472 Ventricular tachycardia, unspecified: Secondary | ICD-10-CM

## 2017-02-02 DIAGNOSIS — I10 Essential (primary) hypertension: Secondary | ICD-10-CM

## 2017-02-02 MED ORDER — LISINOPRIL-HYDROCHLOROTHIAZIDE 20-12.5 MG PO TABS: 1.0000 | ORAL_TABLET | Freq: Every day | ORAL | 3 refills | Status: DC

## 2017-02-02 MED ORDER — ATENOLOL 50 MG PO TABS: 50.0000 mg | ORAL_TABLET | Freq: Every morning | ORAL | 3 refills | Status: DC

## 2017-02-02 NOTE — Progress Notes (Signed)
Patient Care Team: System, Pcp Not In as PCP - General   HPI  Virgina OrganBobby J Harris is a 52 y.o. 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 He's been managed with beta blockers lisinopril HCT.  Blood pressures have been well controlled  No interval tachycardia  He reminds me that he has twins and his daughter survived a terrible car accident       Past Medical History:  Diagnosis Date  . Ventricular tachycardia (HCC)    Left ventricular-Belhassen  . White coat hypertension     Past Surgical History:  Procedure Laterality Date  . INGUINAL HERNIA REPAIR Left 03/15/2012   Procedure: HERNIA REPAIR INGUINAL INCARCERATED;  Surgeon: Kandis Cockingavid H Newman, MD;  [facility];  Service: General;  Laterality: Left;    Current Outpatient Medications  Medication Sig Dispense Refill  . atenolol (TENORMIN) 50 MG tablet Take 1 tablet (50 mg total) by mouth every morning. 30 tablet 2  . lisinopril-hydrochlorothiazide (PRINZIDE,ZESTORETIC) 20-12.5 MG tablet Take 1 tablet by mouth daily. Please keep upcoming appt. for future refills. Thanks 30 tablet 1   No current facility-administered medications for this visit.     Allergies  Allergen Reactions  . Zithromax [Azithromycin] Other (See Comments)    Severe stomach ulcer    Review of Systems negative except from HPI and PMH  Physical Exam BP 130/86   Pulse (!) 59   Ht 6\' 3"  (1.905 m)   Wt 270 lb (122.5 kg)   BMI 33.75 kg/m  Well developed and nourished in no acute distress HENT normal Neck supple with JVP-flat Clear Regular rate and rhythm, no murmurs or gallops Abd-soft with active BS No Clubbing cyanosis edema Skin-warm and dry A & Oriented  Grossly normal sensory and motor function ` ECG demonstrates sinus at 59 Interval 17/10/43 Nonspecific ST flattening  Assessment and  Plan  Hypertension  Ventricular tachycardia --Belhassen   BP well controlled  Need to  check BMET  NO interval VT  Continue current meds

## 2017-02-02 NOTE — Patient Instructions (Signed)
Medication Instructions: Your physician recommends that you continue on your current medications as directed. Please refer to the Current Medication list given to you today.  Labwork: None Ordered  Procedures/Testing: None Ordered  Follow-Up: Your physician wants you to follow-up in 1 YEAR with Dr. Klein. You will receive a reminder letter in the mail two months in advance. If you don't receive a letter, please call our office to schedule the follow-up appointment.   If you need a refill on your cardiac medications before your next appointment, please call your pharmacy.   

## 2017-02-02 NOTE — Telephone Encounter (Signed)
Spoke with patients wife to let her know that Per Dr. Graciela HusbandsKlein patient needed to return to office to have a BMET for surveillance labs. Pt was not available to schedule when I called so the wife said she would pass the message and have patient call back to scheduled blood draw for a day next week. We are both agreeable.

## 2018-01-21 ENCOUNTER — Other Ambulatory Visit: Payer: Self-pay | Admitting: Internal Medicine

## 2018-01-21 MED ORDER — ATENOLOL 50 MG PO TABS: 50.0000 mg | ORAL_TABLET | Freq: Every morning | ORAL | 0 refills | Status: DC

## 2018-02-13 ENCOUNTER — Other Ambulatory Visit: Payer: Self-pay | Admitting: Internal Medicine

## 2018-02-13 NOTE — Telephone Encounter (Signed)
Pt made Jan recall with Graciela Husbands for 04-03-18   *STAT* If patient is at the pharmacy, call can be transferred to refill team.   1. Which medications need to be refilled? (please list name of each medication and dose if known) Linsinopril  2. Which pharmacy/location (including street and city if local pharmacy) is medication to be sent to?[facility] [address]  3. Do they need a 30 day or 90 day supply? Past due appt -refill until 04-03-18

## 2018-02-14 MED ORDER — LISINOPRIL-HYDROCHLOROTHIAZIDE 20-12.5 MG PO TABS: 1.0000 | ORAL_TABLET | Freq: Every day | ORAL | 0 refills | Status: DC

## 2018-02-14 NOTE — Telephone Encounter (Signed)
Pt's medication was sent to pt's pharmacy as requested. Confirmation received.  °

## 2018-03-01 ENCOUNTER — Other Ambulatory Visit: Payer: Self-pay | Admitting: Internal Medicine

## 2018-03-01 MED ORDER — ATENOLOL 50 MG PO TABS: 50.0000 mg | ORAL_TABLET | Freq: Every morning | ORAL | 1 refills | Status: DC

## 2018-03-01 NOTE — Telephone Encounter (Signed)
Pt's medication was sent to pt's pharmacy as requested. Confirmation received.  °

## 2018-03-14 ENCOUNTER — Encounter: Payer: Self-pay | Admitting: Internal Medicine

## 2018-04-01 ENCOUNTER — Ambulatory Visit: Payer: Self-pay | Admitting: Internal Medicine

## 2018-04-16 ENCOUNTER — Telehealth: Payer: Self-pay

## 2018-04-16 MED ORDER — LISINOPRIL-HYDROCHLOROTHIAZIDE 20-12.5 MG PO TABS: 1.0000 | ORAL_TABLET | Freq: Every day | ORAL | 2 refills | Status: DC

## 2018-04-16 MED ORDER — ATENOLOL 50 MG PO TABS: 50.0000 mg | ORAL_TABLET | Freq: Every morning | ORAL | 2 refills | Status: DC

## 2018-04-16 NOTE — Telephone Encounter (Signed)
Spoke with pt to assess his current needs. He states he is "Feeling Great!" He denies sob, CP, or palpitations. He states he knows when he is having episodes and when to report to the ED or call 911 for help.   He has agreed to postpone his appointment until after COVID-19 outbreak. He requests atenolol and prinzide. I have refilled for 90 days x 2 refills.

## 2018-04-24 ENCOUNTER — Ambulatory Visit: Payer: Self-pay | Admitting: Internal Medicine

## 2018-04-24 ENCOUNTER — Telehealth: Payer: Self-pay

## 2018-04-24 NOTE — Telephone Encounter (Signed)
Spoke with pt and his wife confirming interest in VIDEO VISIT with Dr Graciela Husbands next week. Pt will be using sons macbook and his email address. Confirms he can take his vitals and verify medications prior to visit. Informed him he will be receiving a call from [facility] nurse setting up appt time. Pt gave verbal consent to be treated via televisit.

## 2018-04-29 ENCOUNTER — Telehealth (INDEPENDENT_AMBULATORY_CARE_PROVIDER_SITE_OTHER): Payer: Self-pay | Admitting: Internal Medicine

## 2018-04-29 VITALS — BP 127/79 | HR 61 | Ht 75.0 in | Wt 256.0 lb

## 2018-04-29 DIAGNOSIS — I472 Ventricular tachycardia, unspecified: Secondary | ICD-10-CM

## 2018-04-29 DIAGNOSIS — I10 Essential (primary) hypertension: Secondary | ICD-10-CM

## 2018-04-29 MED ORDER — ATENOLOL 50 MG PO TABS: 50.0000 mg | ORAL_TABLET | Freq: Every morning | ORAL | 3 refills | Status: DC

## 2018-04-29 NOTE — Progress Notes (Signed)
Electrophysiology TeleHealth Note   Due to national recommendations of social distancing due to COVID 19, an audio/video telehealth visit is felt to be most appropriate for this patient at this time.  See MyChart message from today for the patient's consent to telehealth for [facility].   Date:  04/29/2018   ID:  Cody Harris, DOB March 06, 1965, MRN [id number]  Location: patient's home  Provider location: [address] [place]  Evaluation Performed: Follow-up visit  PCP:  System, Pcp Not In  Cardiologist:   Electrophysiologist: SK   Chief Complaint:  VT  History of Present Illness:    Cody Harris is a 53 y.o. male who presents via Web designer for a telehealth visit today.  Since last being seen in our clinic, the patient reports doing very well.  Today, he denies symptoms of palpitations, chest pain, shortness of breath,  lower extremity edema, dizziness, presyncope, or syncope.  The patient is otherwise without complaint today.    He has Belhassen VT//LV and hypertension;  Managed with a combination of atenolol and lisinopril /HCT.  The patient denies chest pain, shortness of breath, nocturnal dyspnea, orthopnea or peripheral edema.  There have been no palpitations, lightheadedness or syncope.    The patient denies symptoms of fevers, chills, cough, or new SOB worrisome for COVID 19.    Past Medical History:  Diagnosis Date  . Ventricular tachycardia (HCC)    Left ventricular-Belhassen  . White coat hypertension     Past Surgical History:  Procedure Laterality Date  . INGUINAL HERNIA REPAIR Left 03/15/2012   Procedure: HERNIA REPAIR INGUINAL INCARCERATED;  Surgeon: Kandis Cocking, MD;  Location: [facility] OR;  Service: General;  Laterality: Left;    Current Outpatient Medications  Medication Sig Dispense Refill  . atenolol (TENORMIN) 50 MG tablet Take 1 tablet (50 mg total) by mouth every morning. 90 tablet 3  .  lisinopril-hydrochlorothiazide (PRINZIDE,ZESTORETIC) 20-12.5 MG tablet Take 1 tablet by mouth daily. Please keep upcoming appt in March for future refills. Thank you 90 tablet 2   No current facility-administered medications for this visit.     Allergies:   Zithromax [azithromycin]   Social History:  The patient  reports that he quit smoking about 10 years ago. His smoking use included cigarettes. He smoked 1.00 pack per day. He has quit using smokeless tobacco. He reports current alcohol use. He reports that he does not use drugs.   Family History:  The patient's  family history includes Cancer in his father.   ROS:  Please see the history of present illness.   All other systems are personally reviewed and negative.    Exam:    Vital Signs:  BP 127/79   Pulse 61   Ht 6\' 3"  (1.905 m)   Wt 256 lb (116.1 kg)   BMI 32.00 kg/m     Well appearing, alert and conversant, regular work of breathing,  good skin color Eyes- anicteric, neuro- grossly intact, skin- no apparent rash or lesions or cyanosis, mouth- oral mucosa is pink   Labs/Other Tests and Data Reviewed:    Recent Labs: No results found for requested labs within last 8760 hours.   Wt Readings from Last 3 Encounters:  04/29/18 256 lb (116.1 kg)  02/02/17 270 lb (122.5 kg)  07/02/15 258 lb 6.4 oz (117.2 kg)     Other studies personally reviewed:       ASSESSMENT & PLAN:    Ventricular tachycardia  HTN*  COVID 19 screen The patient denies symptoms of COVID 19 at this time.  The importance of social distancing was discussed today.  Follow-up:  64m    Current medicines are reviewed at length with the patient today.   The patient does not have concerns regarding his medicines.  The following changes were made today:  none  Labs/ tests ordered today include:   No orders of the defined types were placed in this encounter.   Future tests ( post COVID )  i  Patient Risk:  after full review of this patients  clinical status, I feel that they are at moderate risk at this time.  Today, I have spent 16  minutes with the patient with telehealth technology discussing Covid, BP .    Signed, Sherryl Manges, MD  04/29/2018 4:20 PM     [facility] [address] [place] [address] [phone] (office) [phone] (fax)

## 2019-02-04 ENCOUNTER — Other Ambulatory Visit: Payer: Self-pay | Admitting: Internal Medicine

## 2019-02-05 ENCOUNTER — Telehealth: Payer: Self-pay | Admitting: Internal Medicine

## 2019-02-05 NOTE — Telephone Encounter (Signed)
*  STAT* If patient is at the pharmacy, call can be transferred to refill team.   1. Which medications need to be refilled? (please list name of each medication and dose if known) lisinopril-hydrochlorothiazide (ZESTORETIC) 20-12.5 MG tablet  2. Which pharmacy/location (including street and city if local pharmacy) is medication to be sent to? [facility] [address], [place] - [address]  3. Do they need a 30 day or 90 day supply? 90  Medication was not yet at the pharmacy

## 2019-02-05 NOTE — Telephone Encounter (Signed)
Pt's medication was already sent to pt's pharmacy as requested. Confirmation received.  

## 2019-04-17 ENCOUNTER — Telehealth (INDEPENDENT_AMBULATORY_CARE_PROVIDER_SITE_OTHER): Payer: Self-pay | Admitting: Internal Medicine

## 2019-04-17 ENCOUNTER — Other Ambulatory Visit: Payer: Self-pay

## 2019-04-17 VITALS — BP 126/78 | HR 61 | Ht 75.0 in | Wt 251.0 lb

## 2019-04-17 DIAGNOSIS — I10 Essential (primary) hypertension: Secondary | ICD-10-CM

## 2019-04-17 DIAGNOSIS — I472 Ventricular tachycardia, unspecified: Secondary | ICD-10-CM

## 2019-04-17 NOTE — Progress Notes (Signed)
Electrophysiology TeleHealth Note   Due to national recommendations of social distancing due to COVID 19, an audio/video telehealth visit is felt to be most appropriate for this patient at this time.  See MyChart message from today for the patient's consent to telehealth for [facility].   Date:  04/17/2019   ID:  Cody Harris, DOB 10/20/1965, MRN [id number]  Location: patient's home  Provider location: [address] [place]  Evaluation Performed: Follow-up visit  PCP:  System, Pcp Not In  Cardiologist:   Electrophysiologist: SK   Chief Complaint:  VT  History of Present Illness:    Cody Harris is a 54 y.o. male who presents via Web designer for a telehealth visit today.  Since last being seen in our clinic, is doing great  He has Belhassen VT//LV and hypertension;  Managed with a combination of atenolol and lisinopril /HCT.  The patient denies chest pain, shortness of breath, nocturnal dyspnea, orthopnea or peripheral edema.  There have been no palpitations, lightheadedness or syncope.        Past Medical History:  Diagnosis Date  . Ventricular tachycardia (HCC)    Left ventricular-Belhassen  . White coat hypertension     Past Surgical History:  Procedure Laterality Date  . INGUINAL HERNIA REPAIR Left 03/15/2012   Procedure: HERNIA REPAIR INGUINAL INCARCERATED;  Surgeon: Kandis Cocking, MD;  Location: [facility] OR;  Service: General;  Laterality: Left;    Current Outpatient Medications  Medication Sig Dispense Refill  . atenolol (TENORMIN) 50 MG tablet Take 1 tablet (50 mg total) by mouth every morning. 90 tablet 3  . lisinopril-hydrochlorothiazide (ZESTORETIC) 20-12.5 MG tablet Take 1 tablet by mouth daily. Please make yearly appt with Dr. Graciela Husbands for March before anymore refills. 1st attempt 90 tablet 0   No current facility-administered medications for this visit.    Allergies:   Zithromax [azithromycin]   Social History:  The  patient  reports that he quit smoking about 11 years ago. His smoking use included cigarettes. He smoked 1.00 pack per day. He has quit using smokeless tobacco. He reports current alcohol use. He reports that he does not use drugs.   Family History:  The patient's  family history includes Cancer in his father.   ROS:  Please see the history of present illness.   All other systems are personally reviewed and negative.    Exam:    Vital Signs:  BP 126/78   Pulse 61   Ht 6\' 3"  (1.905 m)   Wt 251 lb (113.9 kg)   BMI 31.37 kg/m        Labs/Other Tests and Data Reviewed:    Recent Labs: No results found for requested labs within last 8760 hours.   Wt Readings from Last 3 Encounters:  04/17/19 251 lb (113.9 kg)  04/29/18 256 lb (116.1 kg)  02/02/17 270 lb (122.5 kg)     Other studies personally reviewed:       ASSESSMENT & PLAN:    Ventricular tachycardia  HTN*   BP better controlled  VT quiescient--  Continue current meds    COVID 19 screen The patient denies symptoms of COVID 19 at this time.  The importance of social distancing was discussed today.  Follow-up:  12     Current medicines are reviewed at length with the patient today.   The patient does not have concerns regarding his medicines.  The following changes were made today:  none  Labs/ tests ordered today include:   No orders of the defined types were placed in this encounter.   Future tests ( post COVID )  i  Patient Risk:  after full review of this patients clinical status, I feel that they are at moderate risk at this time.  Today, I have spent 16  minutes with the patient with telehealth technology discussing Covid, BP .    Signed, Virl Axe, MD  04/17/2019 3:40 PM     [place] [address] [phone] (office) [phone] (fax)

## 2019-04-17 NOTE — Patient Instructions (Signed)
Medication Instructions:  Your physician recommends that you continue on your current medications as directed. Please refer to the Current Medication list given to you today. *If you need a refill on your cardiac medications before your next appointment, please call your pharmacy*   Lab Work: None ordered.  If you have labs (blood work) drawn today and your tests are completely normal, you will receive your results only by: . MyChart Message (if you have MyChart) OR . A paper copy in the mail If you have any lab test that is abnormal or we need to change your treatment, we will call you to review the results.   Testing/Procedures: None ordered.    Follow-Up: At [facility], you and your health needs are our priority.  As part of our continuing mission to provide you with exceptional heart care, we have created designated Provider Care Teams.  These Care Teams include your primary Cardiologist (physician) and Advanced Practice Providers (APPs -  Physician Assistants and Nurse Practitioners) who all work together to provide you with the care you need, when you need it.  We recommend signing up for the patient portal called "MyChart".  Sign up information is provided on this After Visit Summary.  MyChart is used to connect with patients for Virtual Visits (Telemedicine).  Patients are able to view lab/test results, encounter notes, upcoming appointments, etc.  Non-urgent messages can be sent to your provider as well.   To learn more about what you can do with MyChart, go to [email or web address].    Your next appointment:   12 months  The format for your next appointment:   In person  Provider:   Dr Klein     

## 2019-05-01 ENCOUNTER — Other Ambulatory Visit: Payer: Self-pay | Admitting: Internal Medicine

## 2019-05-08 ENCOUNTER — Other Ambulatory Visit: Payer: Self-pay | Admitting: Internal Medicine

## 2019-05-08 NOTE — Telephone Encounter (Signed)
Pt's medication was sent to pt's pharmacy as requested. Confirmation received.  °

## 2019-05-11 ENCOUNTER — Telehealth: Payer: Self-pay | Admitting: Physician Assistant

## 2019-05-12 ENCOUNTER — Telehealth: Payer: Self-pay | Admitting: Internal Medicine

## 2019-05-12 NOTE — Telephone Encounter (Signed)
Death certificate received from [employer]. Placed in Dr. Odessa Fleming box for signature. 05/12/19 vlm

## 2019-05-13 NOTE — Telephone Encounter (Signed)
Death Certificate received.  Dr Graciela Husbands states he will not sign death certificate at this point as he has no information surrounding pt's death.  Will forward death certificate to Medical Records.

## 2019-05-16 ENCOUNTER — Telehealth: Payer: Self-pay | Admitting: Internal Medicine

## 2019-05-16 NOTE — Telephone Encounter (Signed)
Follow up:    Patient wife calling back to see the status of some one signing her husband death certificate Please call patient wife.

## 2019-05-16 NOTE — Telephone Encounter (Signed)
[employer] calling to get update on death certificate. Spoke to [place] who stated that she was told he was not signing the death certificate but the ME did call him last night. [employer] states that the patient has been dead for 5 days and were told he had to sign it. They dropped it off Monday or 06/08/2022. Mindi Junker gave the death certificate to medical records. She states she will text Dr. Graciela Husbands and let him know. I let [employer] know the situation and that Dr. Graciela Husbands was doing procedures at the hospital today and then will be out of the office for 2 weeks.

## 2019-05-16 NOTE — Telephone Encounter (Signed)
Wife of the patient was calling back. She needs the death certificate signed so she can put her Husband to rest.

## 2019-05-19 ENCOUNTER — Telehealth: Payer: Self-pay | Admitting: Internal Medicine

## 2019-05-19 NOTE — Telephone Encounter (Signed)
Lucillie Garfinkel from [employer] [facility] calling to get update on death certificate at request of patient's wife. Mr. Cody Harris is willing to provide any additional information needed to complete death certificate. Is asking for a call back today at [phone].

## 2019-05-19 NOTE — Telephone Encounter (Signed)
Samson Frederic from [employer]. [place] natural causes is not acceptable as cause of death per Vital Records. Needs new death certificate signed as soon as possible. Samson Frederic can be reached at [phone]. 05/19/19 vlm

## 2019-05-21 NOTE — Telephone Encounter (Signed)
RN has spoken with Pt's wife.  See Encounter for full details.

## 2019-05-21 NOTE — Telephone Encounter (Signed)
Phone call to [employer] and advised Dr Graciela Husbands is out of office until 06/02/2019.  Cody Harris requests if Dr Graciela Husbands is not available to sign to have one of his colleagues sign the death certificate.   Representative states body cannot be cremated until death certificate is signed.  Advised RN will speak with management re: signature.

## 2019-05-23 NOTE — Telephone Encounter (Signed)
Patient's daughter states she is returning a call in regards to the patient's death certificate. Please advise.

## 2019-05-27 NOTE — Telephone Encounter (Signed)
Pt's death certificate signed by Dr Graciela Husbands and faxed to [employer] on 05/23/2019.

## 2019-05-31 NOTE — Telephone Encounter (Signed)
    EMS called to alert our service that Mr. Stanish passed away. They have asked Dr. Graciela Husbands to sign death certificate. I have asked them to fax to our office.  Cline Crock PA-C  [facility]

## 2019-05-31 DEATH — deceased
